# Patient Record
Sex: Female | Born: 1955 | Race: White | Hispanic: No | Marital: Married | Smoking: Never smoker
Health system: Southern US, Community
[De-identification: ages and names within clinical notes are randomized; demographics above are authoritative.]

## PROBLEM LIST (undated history)

## (undated) DIAGNOSIS — M539 Dorsopathy, unspecified: Secondary | ICD-10-CM

## (undated) DIAGNOSIS — L98499 Non-pressure chronic ulcer of skin of other sites with unspecified severity: Secondary | ICD-10-CM

## (undated) DIAGNOSIS — N3010 Interstitial cystitis (chronic) without hematuria: Secondary | ICD-10-CM

## (undated) DIAGNOSIS — I341 Nonrheumatic mitral (valve) prolapse: Secondary | ICD-10-CM

## (undated) DIAGNOSIS — M1990 Unspecified osteoarthritis, unspecified site: Secondary | ICD-10-CM

## (undated) DIAGNOSIS — B019 Varicella without complication: Secondary | ICD-10-CM

## (undated) DIAGNOSIS — Z9109 Other allergy status, other than to drugs and biological substances: Secondary | ICD-10-CM

## (undated) HISTORY — PX: OOPHORECTOMY: SHX86

## (undated) HISTORY — DX: Varicella without complication: B01.9

## (undated) HISTORY — DX: Other allergy status, other than to drugs and biological substances: Z91.09

## (undated) HISTORY — DX: Nonrheumatic mitral (valve) prolapse: I34.1

## (undated) HISTORY — PX: NASAL SEPTUM SURGERY: SHX37

## (undated) HISTORY — DX: Non-pressure chronic ulcer of skin of other sites with unspecified severity: L98.499

## (undated) HISTORY — PX: BLADDER SURGERY: SHX569

## (undated) HISTORY — PX: KNEE SURGERY: SHX244

## (undated) HISTORY — PX: MASTECTOMY: SHX3

## (undated) HISTORY — PX: TONSILLECTOMY: SUR1361

## (undated) HISTORY — PX: ABDOMINAL HYSTERECTOMY: SHX81

---

## 1998-04-07 ENCOUNTER — Ambulatory Visit (HOSPITAL_COMMUNITY): Admission: RE | Admit: 1998-04-07 | Discharge: 1998-04-07 | Payer: Self-pay | Admitting: Obstetrics & Gynecology

## 1998-04-07 ENCOUNTER — Encounter: Payer: Self-pay | Admitting: Obstetrics & Gynecology

## 1999-07-18 ENCOUNTER — Other Ambulatory Visit: Admission: RE | Admit: 1999-07-18 | Discharge: 1999-07-18 | Payer: Self-pay | Admitting: Obstetrics & Gynecology

## 1999-09-26 ENCOUNTER — Ambulatory Visit (HOSPITAL_COMMUNITY): Admission: RE | Admit: 1999-09-26 | Discharge: 1999-09-26 | Payer: Self-pay | Admitting: Obstetrics & Gynecology

## 1999-09-26 ENCOUNTER — Encounter: Payer: Self-pay | Admitting: Obstetrics & Gynecology

## 2000-03-21 ENCOUNTER — Inpatient Hospital Stay (HOSPITAL_COMMUNITY): Admission: RE | Admit: 2000-03-21 | Discharge: 2000-03-24 | Payer: Self-pay | Admitting: Obstetrics & Gynecology

## 2000-03-21 ENCOUNTER — Encounter (INDEPENDENT_AMBULATORY_CARE_PROVIDER_SITE_OTHER): Payer: Self-pay

## 2000-12-27 ENCOUNTER — Other Ambulatory Visit: Admission: RE | Admit: 2000-12-27 | Discharge: 2000-12-27 | Payer: Self-pay | Admitting: Obstetrics & Gynecology

## 2000-12-27 ENCOUNTER — Encounter: Payer: Self-pay | Admitting: Obstetrics & Gynecology

## 2000-12-27 ENCOUNTER — Ambulatory Visit (HOSPITAL_COMMUNITY): Admission: RE | Admit: 2000-12-27 | Discharge: 2000-12-27 | Payer: Self-pay | Admitting: Obstetrics & Gynecology

## 2010-12-23 NOTE — Discharge Summary (Signed)
[facility]  Patient:    Tanya Griffith, Tanya Griffith                   MRN: [id number] Adm. Date:  [id number] Disc. Date: [id number] Attending:  Minette Headland                           Discharge Summary  DISCHARGE DIAGNOSES: 1. Persistent complex adnexal mass. 2. Pelvic endometriosis 3. Extensive uterine adenomyosis. 4. Extensive pelvic adhesions.  OPERATIVE PROCEDURE: 1. Total abdominal hysterectomy with bilateral salpingo-oophorectomy. 2. Lysis of pelvic adhesions.  POSTOPERATIVE COMPLICATIONS:  Mild postoperative ileus, resolved spontaneously with conservative measures.  DISPOSITION AT DISCHARGE:  The patient was in good condition.  She was ambulating without difficulty.  She was having adequate bowel and bladder function at that point.  DISCHARGE MEDICATIONS:  She was discharged with Demerol tablets 15 mg to be taken 1-2 every 4-6 hours as needed for pain.  She was also to add ibuprofen. She is to resume any preoperative medications and is on hormone replacement therapy.  DISCHARGE INSTRUCTIONS: 1. She is to follow up in the office in two weeks. 2. She is to call for fever, severe pain or heavy bleeding. 3. She is to avoid ______ .  BRIEF HISTORY:  Details of the present illness are recorded in the admission or in the operative summary.  Briefly, the patient was found to have a persistent complex adnexal mass, thought most likely to be endometriosis.  She was admitted at this time for definitive surgery with a history also of metrorrhagia and dysmenorrhea.  LABORATORY DATA:  During this admission hemoglobin 12.6, hemoglobin 11.9 on the first postoperative day and 11.2 on the second postoperative day.  Her admission prothrombin time and INR and PTT were all normal.  Her urinalysis on admission was normal.  HOSPITAL COURSE:  The patient was admitted on the morning of surgery. She was taken to the operating room where the above described  operative procedure was accomplished without significant intraoperative difficulty.  Her postoperative course was without major complication.  She did have slow return of bowel function and by the morning of the second postoperative day had still not been able to pass gas rectally.  Her abdomen was soft but mildly distended. It was felt she had a mild ileus and it was elected to keep her until the morning of the third postoperative day.  By that time she was having adequate bowel function.  She was tolerating a regular diet.  She was discharged with disposition as noted above. DD:  04/18/00 TD:  04/18/00 Job: [id number]

## 2010-12-23 NOTE — Op Note (Signed)
[facility]  Patient:    Tanya Griffith, Tanya Griffith                   MRN: [id number] Proc. Date: 03/21/00 Adm. Date:  [id number] Attending:  Minette Headland                           Operative Report  PREOPERATIVE DIAGNOSES:       Complex right adnexal mass, dysfunctional uterine bleeding, known history of significant pelvic endometriosis and pelvic adhesions.  POSTOPERATIVE DIAGNOSES:      Complex right adnexal mass, dysfunctional uterine bleeding, known history of significant pelvic endometriosis and pelvic adhesions.  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  OPERATION PROCEDURE:          Total abdominal hysterectomy, right salpingo-oophorectomy, lysis of pelvic adhesions, inspection of appendix with normal findings.  Palpation of the gallbladder revealed at least one small stone or polyp measuring 3-4 mm.  There were no other palpable abnormalities of the upper abdomen.  ANESTHESIA:                   General endotracheal.  INTRAOPERATIVE COMPLICATIONS: None.  ESTIMATED INTRAOPERATIVE BLOOD LOSS:                   200 cc.  DETAILS OF PRESENT ILLNESS:   Recorded in the admission.  Patient was admitted on the morning of surgery.  She was given antibiotic prophylaxis for ______ using ampicillin and gentamicin.  She is known to have mitral regurgitation, which is asymptomatic.  DESCRIPTION OF PROCEDURE:     She was placed in PAS hoses.  She was brought to the operating room and placed under adequate general endotracheal anesthesia and placed in the dorsal recumbent position.  Abdomen, perineum and vagina were prepped in the usual fashion and a Foley catheter was placed using sterile technique.  Sterile drapes were applied.  Lower abdominal transverse incision was made through an old scar and carried sharply down to fascia. Fascia was entered sharply then extended to the extent of  skin incision. Rectus sheath was developed superiorly and inferiorly with blunt and sharp dissection. Rectus muscles were divided in the midline and the peritoneum was entered sharply and extended bluntly and sharply to the extent of the skin incision.  The adhesions were immediately noticed the bowel to uterus and to left adnexa.  There was distortion and ______  of the right fallopian tube, which was in a mass with the ovary at the superior pole of the uterine cornu. Self-retaining OConnor-OSullivan retractor was placed and large packs were used to pack intestinal contents out of the pelvis.  Careful sharp dissection was utilized to free a loop of ileum from the right adnexa.  Later during the procedure before completion, the residual ovarian tissue left on the bowel was carefully sharply dissected free.  Bowel muscularis was not entered. Figure-of-eights of 3-0 Monocryl using GI needle were used for complete hemostasis at this site.  Adhesions of sigmoid epiploica to the left adnexa were free sharply and bluntly.  Proximal broad ligaments were clamped with Kellys.  A pedicle was developed on the left, which was the remnant of the infundibular pelvic using a curved Heaney.  This was divided sharply and ligated with 0 Monocryl.  The right side was taken with a figure-of-eight of 0 Monocryl for the round ligament, which  was then divided.  The infundibular pelvic ligament was developed, cross-clamped, divided sharply and doubly ligated with free tie of 0 Monocryl, followed by suture ligature of 0 Monocryl.  Vessel pedicles were skeletonized.  Adhesions of the anterior peritoneum were freed from the cervix and lower uterine segment and the bladder bluntly and sharply dissected off the lower segment and cervix. Curved Heaneys were placed on the vessel pedicles on each side, divided sharply and ligated with 0 Monocryl.  Straight Heaneys were used to develop the cardinal ligament pedicles.   These too were divided sharply and ligated with 0 Monocryl.  Curved Heaneys were placed on uterosacrals and angles of vagina.  These pedicles were divided sharply and ligated with 0 Monocryl in a Heaney fashion.  Cervix was completely excised and inspected and found to be completely removed.  Cuff was closed with figure-of-eights of 0 Monocryl. Small bleeding source along the right uterosacral was fulgurated with the Bovie and complete hemostasis was achieved.  Irrigation was carried out. Systematic examination of the pelvic structures dissected during the procedure were examined and hemostasis was noted to be complete.  All packs, needles and instruments removed, counts were correct.  Abdominal incision was then closed in layers.  Figure-of-eights of 0 Monocryl were used to reapproximate the rectus muscles.  Fascia was closed with running 0 PDS.  Subcu was closed with interrupted 0 Monocryl or 2-0 plains.  Subcutaneous vessels were controlled with the Bovie and figure-of-eights of 0 Monocryl in one location near the midline.  Skin was closed with wide skin staples and 1/4 inch Steri-Strips. The patient tolerated the procedure well and was taken to recovery in good condition. DD:  03/21/00 TD:  03/21/00 Job: [id number]

## 2011-04-16 ENCOUNTER — Encounter: Payer: Self-pay | Admitting: Emergency Medicine

## 2011-04-16 ENCOUNTER — Inpatient Hospital Stay (INDEPENDENT_AMBULATORY_CARE_PROVIDER_SITE_OTHER)
Admission: RE | Admit: 2011-04-16 | Discharge: 2011-04-16 | Disposition: A | Discharge status: Home / Self Care | Payer: 59 | Source: Ambulatory Visit | Attending: Emergency Medicine | Admitting: Emergency Medicine

## 2011-04-16 DIAGNOSIS — J069 Acute upper respiratory infection, unspecified: Secondary | ICD-10-CM

## 2011-04-16 DIAGNOSIS — R059 Cough, unspecified: Secondary | ICD-10-CM

## 2011-04-16 DIAGNOSIS — R05 Cough: Secondary | ICD-10-CM

## 2011-07-10 NOTE — Progress Notes (Signed)
Summary: cough/TM   Vital Signs:  Patient Profile:   55 Years Old Female CC:      Cough Height:     59 inches Weight:      174 pounds O2 treatment:    Room Air Temp:     98.1 degrees F oral Pulse rate:   92 / minute Resp:     20 per minute BP sitting:   125 / 83  (left arm) Cuff size:   regular  Vitals Entered By: Linton Flemings RN (April 16, 2011 4:38 PM)                  Updated Prior Medication List: AMBIEN 10 MG TABS (ZOLPIDEM TARTRATE) HS  Current Allergies: ! * ENVIROMENTALHistory of Present Illness History from: patient Chief Complaint: Cough History of Present Illness: 55 Years Old Female complains of onset of cold symptoms for 2.5 weeks.  Tanya Griffith has been using Zyrtec-D which is helping a little bit. No sore throat + cough No pleuritic pain No wheezing No nasal congestion + post-nasal drainage No sinus pain/pressure No chest congestion No itchy/red eyes No earache No hemoptysis No SOB No chills/sweats No fever No nausea No vomiting No abdominal pain No diarrhea No skin rashes + fatigue No myalgias No headache   REVIEW OF SYSTEMS Constitutional Symptoms       Complains of fatigue.     Denies fever, chills, night sweats, weight loss, and weight gain.  Eyes       Complains of eye drainage.      Denies change in vision, eye pain, glasses, contact lenses, and eye surgery. Ear/Nose/Throat/Mouth       Complains of frequent runny nose and hoarseness.      Denies hearing loss/aids, change in hearing, ear pain, ear discharge, dizziness, frequent nose bleeds, sinus problems, sore throat, and tooth pain or bleeding.  Respiratory       Complains of dry cough.      Denies productive cough, wheezing, shortness of breath, asthma, bronchitis, and emphysema/COPD.  Cardiovascular       Complains of murmurs and chest pain.      Denies tires easily with exhertion.    Gastrointestinal       Complains of nausea/vomiting.      Denies stomach pain, diarrhea,  constipation, blood in bowel movements, and indigestion. Genitourniary       Denies painful urination, kidney stones, and loss of urinary control. Neurological       Complains of headaches.      Denies paralysis, seizures, and fainting/blackouts. Musculoskeletal       Denies muscle pain, joint pain, joint stiffness, decreased range of motion, redness, swelling, muscle weakness, and gout.  Skin       Denies bruising, unusual mles/lumps or sores, and hair/skin or nail changes.  Psych       Denies mood changes, temper/anger issues, anxiety/stress, speech problems, depression, and sleep problems. Other Comments: COUGH X 2 WEEKS   Past History:  Past Medical History: INTERSTISTIAL CYSTIS  Past Surgical History: Denies surgical history  Social History: SMOKE-NO ALCOHOL-NO REC. DRUGS-NO Physical Exam General appearance: well developed, well nourished, no acute distress Ears: normal, no lesions or deformities Nasal: mucosa pink, nonedematous, no septal deviation, turbinates normal Oral/Pharynx: clear PND, no erythema Chest/Lungs: no rales, wheezes, or rhonchi bilateral, breath sounds equal without effort Heart: regular rate and  rhythm, no murmur MSE: oriented to time, place, and person Assessment New Problems: ALLERGIC RHINITIS (ICD-477.9) COUGH (ICD-786.2)  UPPER RESPIRATORY INFECTION, ACUTE (ICD-465.9)   Plan New Medications/Changes: AMOXICILLIN 875 MG TABS (AMOXICILLIN) 1 by mouth two times a day for 7 days  #14 x 0, 04/16/2011, Hoyt Koch MD TESSALON 200 MG CAPS (BENZONATATE) 1 by mouth three times a day as needed for cough  #21 x 0, 04/16/2011, Hoyt Koch MD  New Orders: New Patient Level III [phone] Pulse Oximetry (single measurment) [94760] Planning Comments:   1)  Take the prescribed antibiotic as instructed.  Tessalon for cough. 2)  Use nasal saline solution (over the counter) at least 3 times a day. 3)  Use over the counter decongestants like  Zyrtec-D every 12 hours as needed to help with congestion. 4)  Can take tylenol every 6 hours or motrin every 8 hours for pain or fever. 5)  Follow up with your primary doctor  if no improvement in 5-7 days, sooner if increasing pain, fever, or new symptoms.  If not improving, need to consider Flonase vs prednisone.   The patient and/or caregiver has been counseled thoroughly with regard to medications prescribed including dosage, schedule, interactions, rationale for use, and possible side effects and they verbalize understanding.  Diagnoses and expected course of recovery discussed and will return if not improved as expected or if the condition worsens. Patient and/or caregiver verbalized understanding.  Prescriptions: AMOXICILLIN 875 MG TABS (AMOXICILLIN) 1 by mouth two times a day for 7 days  #14 x 0   Entered and Authorized by:   Hoyt Koch MD   Signed by:   Hoyt Koch MD on 04/16/2011   Method used:   Print then Give to Patient   RxID:   [id number] TESSALON 200 MG CAPS (BENZONATATE) 1 by mouth three times a day as needed for cough  #21 x 0   Entered and Authorized by:   Hoyt Koch MD   Signed by:   Hoyt Koch MD on 04/16/2011   Method used:   Print then Give to Patient   RxID:   [id number]   Orders Added: 1)  New Patient Level III [64403] 2)  Pulse Oximetry (single measurment) [47425]

## 2012-01-05 ENCOUNTER — Telehealth: Payer: Self-pay | Admitting: *Deleted

## 2012-01-05 ENCOUNTER — Encounter: Payer: Self-pay | Admitting: Emergency Medicine

## 2012-01-05 ENCOUNTER — Emergency Department (INDEPENDENT_AMBULATORY_CARE_PROVIDER_SITE_OTHER)
Admission: EM | Admit: 2012-01-05 | Discharge: 2012-01-05 | Disposition: A | Discharge status: Home / Self Care | Payer: 59 | Source: Home / Self Care | Attending: Emergency Medicine | Admitting: Emergency Medicine

## 2012-01-05 DIAGNOSIS — H01009 Unspecified blepharitis unspecified eye, unspecified eyelid: Secondary | ICD-10-CM

## 2012-01-05 MED ORDER — PREDNISONE (PAK) 10 MG PO TABS: 10.0000 mg | ORAL_TABLET | Freq: Every day | ORAL | Status: AC

## 2012-01-05 MED ORDER — OLOPATADINE HCL 0.1 % OP SOLN: 1.0000 [drp] | Freq: Two times a day (BID) | OPHTHALMIC | Status: AC

## 2012-01-05 NOTE — ED Notes (Signed)
Bilateral eye lid swelling redness, itching, burning, eyes tearing x 3 days

## 2012-01-05 NOTE — ED Provider Notes (Signed)
History     CSN: [id number]  Arrival date & time 01/05/12  1114   First MD Initiated Contact with Patient 01/05/12 1116      Chief Complaint  Patient presents with  . Eye Problem    (Consider location/radiation/quality/duration/timing/severity/associated sxs/prior treatment) HPI Tanya Griffith presents today with an EYE COMPLAINT.  Eyelid swelling.  This has happened before.  She does have a history of seasonal allergies.  No trauma or flying debris.  She does wear makeup but nothing is changed recently.  She has tried some topical hydrocortisone cream around the eyelids which has helped.  Location: bilateral  Onset: 3  Days   Symptoms: Redness: no Discharge: no Pain: no Photophobia: no Decreased Vision: no URI symptoms: no Itching/Allergy sxs: no Glaucoma: no Recent eye surgery: no Contact lens use: no  Red Flags Trauma: no Foreign Body: no Vomiting/HA: no Halos around lights: no Chickenpox or zoster: no     No past medical history on file.  Past Surgical History  Procedure Date  . Abdominal hysterectomy   . Tonsillectomy   . Bladder surgery     No family history on file.  History  Substance Use Topics  . Smoking status: Not on file  . Smokeless tobacco: Not on file  . Alcohol Use:     OB History    Grav Para Term Preterm Abortions TAB SAB Ect Mult Living                  Review of Systems  All other systems reviewed and are negative.    Allergies  Review of patient's allergies indicates no known allergies.  Home Medications   Current Outpatient Rx  Name Route Sig Dispense Refill  . OLOPATADINE HCL 0.1 % OP SOLN Both Eyes Place 1 drop into both eyes 2 (two) times daily. 5 mL 2  . PREDNISONE (PAK) 10 MG PO TABS Oral Take 1 tablet (10 mg total) by mouth daily. 6 day pack, use as directed, Disp 1 pack 21 tablet 0    BP 121/77  Pulse 78  Temp(Src) 98.3 F (36.8 C) (Oral)  Resp 12  Ht 5' (1.524 m)  Wt 176 lb (79.833 kg)  BMI 34.37 kg/m2   SpO2 97%  Physical Exam  Nursing note and vitals reviewed. Constitutional: She is oriented to person, place, and time. She appears well-developed and well-nourished.  HENT:  Head: Normocephalic and atraumatic.  Eyes: Conjunctivae and EOM are normal. Pupils are equal, round, and reactive to light. Right eye exhibits no discharge and no exudate. Left eye exhibits no discharge and no exudate. No scleral icterus.       Mild eyelid swelling bilaterally, no erythema or signs of bacterial infection.  No foreign bodies.  No discharge.  Neck: Neck supple.  Cardiovascular: Regular rhythm and normal heart sounds.   Pulmonary/Chest: Effort normal and breath sounds normal. No respiratory distress.  Neurological: She is alert and oriented to person, place, and time.  Skin: Skin is warm and dry.  Psychiatric: She has a normal mood and affect. Her speech is normal.    ED Course  Procedures (including critical care time)  Labs Reviewed - No data to display No results found.   1. 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  I gave her prescription for prednisone that she will likely only need about half of.  I also gave her prescription for allergy eyedrops.  She can also  use an allergy oral pill such as Benadryl.  Encourage cool compresses on eyes and sleeping with head elevated.    Marlaine Hind, MD 01/05/12 [phone]

## 2013-03-05 ENCOUNTER — Encounter: Payer: Self-pay | Admitting: *Deleted

## 2013-03-05 ENCOUNTER — Emergency Department
Admission: EM | Admit: 2013-03-05 | Discharge: 2013-03-05 | Disposition: A | Discharge status: Home / Self Care | Payer: No Typology Code available for payment source | Source: Home / Self Care

## 2013-03-05 DIAGNOSIS — J329 Chronic sinusitis, unspecified: Secondary | ICD-10-CM

## 2013-03-05 DIAGNOSIS — H6091 Unspecified otitis externa, right ear: Secondary | ICD-10-CM

## 2013-03-05 DIAGNOSIS — H60399 Other infective otitis externa, unspecified ear: Secondary | ICD-10-CM

## 2013-03-05 HISTORY — DX: Interstitial cystitis (chronic) without hematuria: N30.10

## 2013-03-05 MED ORDER — AMOXICILLIN 875 MG PO TABS: 875.0000 mg | ORAL_TABLET | Freq: Two times a day (BID) | ORAL | Status: DC

## 2013-03-05 MED ORDER — NEOMYCIN-POLYMYXIN-HC 3.5-10000-1 OT SOLN: 3.0000 [drp] | Freq: Four times a day (QID) | OTIC | Status: AC

## 2013-03-05 NOTE — ED Notes (Signed)
Pt c/o Rt ear pain x several weeks, with HA x 1wk and RT eye redness, and RT sided tooth ache x today.

## 2013-03-05 NOTE — ED Provider Notes (Signed)
CSN: [id number]     Arrival date & time 03/05/13  1533 History     None    Chief Complaint  Patient presents with  . Nasal Congestion    HPI  URI Symptoms Onset: 1-2 weeks  Description: ear pain, sinus pressure, nasal congestion, tooth pain  Modifying factors:  None   Symptoms Nasal discharge: mild Fever: no Sore throat: no Cough: no Wheezing: no Ear pain: no GI symptoms: no Sick contacts: no  Red Flags  Stiff neck: no Dyspnea: no Rash: no Swallowing difficulty: no  Sinusitis Risk Factors Headache/face pain: yes Double sickening: yes tooth pain: yes  Allergy Risk Factors Sneezing: no Itchy scratchy throat: no Seasonal symptoms: no  Flu Risk Factors Headache: no muscle aches: no severe fatigue: no   Past Medical History  Diagnosis Date  . IC (interstitial cystitis)    Past Surgical History  Procedure Laterality Date  . Abdominal hysterectomy    . Tonsillectomy    . Bladder surgery     Family History  Problem Relation Age of Onset  . Dementia Mother   . Cancer Sister     skin   History  Substance Use Topics  . Smoking status: Never Smoker   . Smokeless tobacco: Not on file  . Alcohol Use: No   OB History   Grav Para Term Preterm Abortions TAB SAB Ect Mult Living                 Review of Systems  All other systems reviewed and are negative.    Allergies  Other  Home Medications   Current Outpatient Rx  Name  Route  Sig  Dispense  Refill  . zolpidem (AMBIEN) 10 MG tablet   Oral   Take 10 mg by mouth at bedtime as needed for sleep.         Marland Kitchen amoxicillin (AMOXIL) 875 MG tablet   Oral   Take 1 tablet (875 mg total) by mouth 2 (two) times daily.   20 tablet   0   . neomycin-polymyxin-hydrocortisone (CORTISPORIN) otic solution   Right Ear   Place 3 drops into the right ear 4 (four) times daily.   10 mL   0    BP 145/84  Pulse 91  Temp(Src) 98.3 F (36.8 C) (Oral)  Resp 16  Wt 170 lb (77.111 kg)  BMI 33.2 kg/m2   SpO2 97% Physical Exam  Constitutional: She appears well-developed and well-nourished.  HENT:  Head: Atraumatic.  Left Ear: External ear normal.  r ear canal erythema and tenderness to otoscopic evaluation  +nasal erythema, rhinorrhea bilaterally, + post oropharyngeal erythema  + R maxillary TTP    Eyes: Conjunctivae are normal. Pupils are equal, round, and reactive to light.  Neck: Normal range of motion.  Cardiovascular: Normal rate and regular rhythm.   Pulmonary/Chest: Effort normal.  Abdominal: Soft.  Musculoskeletal: Normal range of motion.  Lymphadenopathy:    She has no cervical adenopathy.  Neurological: She is alert.  Skin: Skin is warm.    ED Course   Procedures (including critical care time)  Labs Reviewed - No data to display No results found. 1. OE (otitis externa), right   2. Sinusitis     MDM  Will treat with amox and cortisporin Discussed general and ENT red flags.  Follow up as needed.     The patient and/or caregiver has been counseled thoroughly with regard to treatment plan and/or medications prescribed including dosage, schedule, interactions, rationale  for use, and possible side effects and they verbalize understanding. Diagnoses and expected course of recovery discussed and will return if not improved as expected or if the condition worsens. Patient and/or caregiver verbalized understanding.       Doree Albee, MD 03/05/13 1640

## 2014-04-21 ENCOUNTER — Telehealth: Payer: Self-pay | Admitting: Family Medicine

## 2014-04-21 ENCOUNTER — Encounter: Payer: Self-pay | Admitting: Emergency Medicine

## 2014-04-21 ENCOUNTER — Emergency Department
Admission: EM | Admit: 2014-04-21 | Discharge: 2014-04-21 | Disposition: A | Discharge status: Home / Self Care | Payer: 59 | Source: Home / Self Care | Attending: Physician Assistant | Admitting: Physician Assistant

## 2014-04-21 DIAGNOSIS — W19XXXA Unspecified fall, initial encounter: Secondary | ICD-10-CM

## 2014-04-21 DIAGNOSIS — S199XXA Unspecified injury of neck, initial encounter: Secondary | ICD-10-CM

## 2014-04-21 DIAGNOSIS — S0993XA Unspecified injury of face, initial encounter: Secondary | ICD-10-CM

## 2014-04-21 HISTORY — DX: Dorsopathy, unspecified: M53.9

## 2014-04-21 HISTORY — DX: Unspecified osteoarthritis, unspecified site: M19.90

## 2014-04-21 HISTORY — DX: Interstitial cystitis (chronic) without hematuria: N30.10

## 2014-04-21 MED ORDER — PREDNISONE 50 MG PO TABS
50.0000 mg | ORAL_TABLET | Freq: Every day | ORAL | Status: DC
Start: 2014-04-21 — End: 2014-06-05

## 2014-04-21 NOTE — Telephone Encounter (Signed)
agree

## 2014-04-21 NOTE — Telephone Encounter (Signed)
Caller name: Jarelyn  Relation to pt: self  Call back number: [phone]   Reason for call:   New Patient has an upcoming appt 10/30.pt fell 9/13 face is swollen pt would like to schedule an acute visit rather then go to urgent care. Please advise

## 2014-04-21 NOTE — Discharge Instructions (Signed)
Ice and ibuprofen.  Prednisone for 5 days.

## 2014-04-21 NOTE — ED Provider Notes (Signed)
CSN: [id number]     Arrival date & time 04/21/14  1443 History   First MD Initiated Contact with Patient 04/21/14 1455     Chief Complaint  Patient presents with  . Facial Pain   (Consider location/radiation/quality/duration/timing/severity/associated sxs/prior Treatment) HPI Pt presents to the clinic after a fall on Sunday, 2 days ago. She was walking and talking at [facility] when she tripped over uneven sidewalk and landed on right hand and right side of face. She had a little bit of a nose bleed and was sore but no major pain. No pain with chewing or moving jaw. She has taken aleve and seems to help pain. She denies any teeth being loose. No pain with eating and face just sore. She is concerned because the right side of her face does feel numb. Seems to be getting some better but wanted to make sure nothing wrong.    Past Medical History  Diagnosis Date  . IC (interstitial cystitis)   . Interstitial cystitis   . 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 Back problem    Past Surgical History  Procedure Laterality Date  . Abdominal hysterectomy    . Tonsillectomy    . Bladder surgery    . Oophorectomy    . Knee surgery Right   . Bladder surgery     Family History  Problem Relation Age of Onset  . Dementia Mother   . Alzheimer's disease Mother   . Cancer Sister     skin   History  Substance Use Topics  . Smoking status: Never Smoker   . Smokeless tobacco: Not on file  . Alcohol Use: No   OB History   Grav Para Term Preterm Abortions TAB SAB Ect Mult Living                 Review of Systems  All other systems reviewed and are negative.   Allergies  Other and Sulfa antibiotics  Home Medications   Prior to Admission medications   Medication Sig Start Date End Date Taking? Authorizing Provider  amoxicillin (AMOXIL) 875 MG tablet Take 1 tablet (875 mg total) by mouth 2 (two) times daily. 03/05/13   Shanda Howells, MD  predniSONE (DELTASONE)  50 MG tablet Take 1 tablet (50 mg total) by mouth daily. 04/21/14   Lanorris Kalisz L Tiffini Blacksher, PA-C  zolpidem (AMBIEN) 10 MG tablet Take 10 mg by mouth at bedtime as needed for sleep.    Historical Provider, MD   BP 128/81  Pulse 73  Temp(Src) 98.1 F (36.7 C) (Oral)  Resp 16  Ht 5' (1.524 m)  Wt 168 lb (76.204 kg)  BMI 32.81 kg/m2  SpO2 97% Physical Exam  Constitutional: She appears well-developed and well-nourished.  HENT:  Head:    Right Ear: External ear normal.  Left Ear: External ear normal.  Mouth/Throat: Oropharynx is clear and moist.  No significant pain with palpation over bones of face.  Teeth are strong and not firm in place.  Most pain over areas where her face is bruised.  No pain over septum of nose. Pain with movement of cartilage. Nasal turbinate of right nares very swollen.    Eyes: Conjunctivae are normal. Right eye exhibits no discharge. Left eye exhibits no discharge.  Neck: Normal range of motion. Neck supple.  Lymphadenopathy:    She has no cervical adenopathy.  Psychiatric: She has a normal mood and affect. Her behavior is normal.    ED  Course  Procedures (including critical care time) Labs Review Labs Reviewed - No data to display  Imaging Review No results found.   MDM   1. Fall, initial encounter   2. Facial trauma, initial encounter    After physical exam there was no area of concern for acute fracture. All pain seems to be more soft tissue related.  Reassured pt that numbness coming from soft tissue swelling and trauma. It can take a couple of weeks for nerves to heal and all swelling to go down.  Given prednisone to help with inflammation.  Continue ice packs as needed and Aleve for pain.  Follow up with any worsening signs of symptoms.       Donella Stade, PA-C 04/21/14 1603

## 2014-04-21 NOTE — ED Notes (Signed)
Pt reports that she fell on a sidewalk x 2 days ago and hit her face on the sidewalk. No OTC meds.

## 2014-04-21 NOTE — Telephone Encounter (Addendum)
C/o: On 9/13, pt fell while at the [facility]  She tripped and fell on sidewalk hitting her face.  She sustained a headache and nosebleed that day.  + right sided facial swelling and bruising, and bilateral knee bruising down her legs.  She also states that she feels pressure (similar to a sinus infection) and tingling under her right eye, down her nose, to the top of her lips.  She denies a headache today.  She alert and oriented x 3.  Denies dizziness, N/V, and memory issues.    New patient appointment scheduled with Dr. Etter Sjogren on 06/05/14 @ 3 pm.    Advice:  Go to urgent care today for assessment.  Pt stated understanding and was in agreement with plan.

## 2014-04-27 NOTE — ED Provider Notes (Signed)
Agree with exam, assessment, and plan.   Kandra Nicolas, MD 04/27/14 1159

## 2014-06-05 ENCOUNTER — Encounter: Payer: Self-pay | Admitting: Family Medicine

## 2014-06-05 ENCOUNTER — Ambulatory Visit (INDEPENDENT_AMBULATORY_CARE_PROVIDER_SITE_OTHER): Payer: 59 | Admitting: Family Medicine

## 2014-06-05 VITALS — BP 140/80 | HR 72 | Temp 98.1°F | Wt 172.2 lb

## 2014-06-05 DIAGNOSIS — Z833 Family history of diabetes mellitus: Secondary | ICD-10-CM

## 2014-06-05 DIAGNOSIS — R011 Cardiac murmur, unspecified: Secondary | ICD-10-CM

## 2014-06-05 DIAGNOSIS — Z0000 Encounter for general adult medical examination without abnormal findings: Secondary | ICD-10-CM

## 2014-06-05 DIAGNOSIS — Z1239 Encounter for other screening for malignant neoplasm of breast: Secondary | ICD-10-CM

## 2014-06-05 DIAGNOSIS — E2839 Other primary ovarian failure: Secondary | ICD-10-CM

## 2014-06-05 LAB — POCT URINALYSIS DIPSTICK
Bilirubin, UA: NEGATIVE
Blood, UA: NEGATIVE
GLUCOSE UA: NEGATIVE
Ketones, UA: NEGATIVE
LEUKOCYTES UA: NEGATIVE
NITRITE UA: NEGATIVE
PROTEIN UA: NEGATIVE
Spec Grav, UA: 1.015
Urobilinogen, UA: 0.2
pH, UA: 6

## 2014-06-05 NOTE — Progress Notes (Signed)
Subjective:     Tanya Griffith is a 58 y.o. female and is here for a comprehensive physical exam. The patient reports no problems.  History   Social History  . Marital Status: Married    Spouse Name: N/A    Number of Children: N/A  . Years of Education: N/A   Occupational History  . Not on file.   Social History Main Topics  . Smoking status: Never Smoker   . Smokeless tobacco: Not on file  . Alcohol Use: No  . Drug Use: No  . Sexual Activity: Not on file   Other Topics Concern  . Not on file   Social History Narrative  . No narrative on file   Health Maintenance  Topic Date Due  . Tetanus/tdap  02/02/1974  . Mammogram  02/02/2005  . Colonoscopy  02/02/2005  . 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 She  does not have a problem list on file. She  has past surgical history that includes Abdominal hysterectomy; Tonsillectomy; Bladder surgery; Oophorectomy; Knee surgery (Right); Bladder surgery; Nasal septum surgery; and Cesarean section. Her family history includes Alzheimer's disease in her mother; Arthritis in her brother, maternal aunt, maternal grandfather, and mother; Cancer in her sister; Dementia in her mother; Diabetes in her brother and maternal aunt; Heart attack in her paternal grandfather; Hyperlipidemia in her mother; Hypertension in her brother; Kidney disease (age of onset: 78) in her mother; Transient ischemic attack in her maternal aunt and mother. She  reports that she has never smoked. She does not have any smokeless tobacco history on file. She reports that she does not drink alcohol or use illicit  drugs. She has a current medication list which includes the following prescription(s): aspirin, calcium-magnesium, flax seed oil, msm, mirabegron er, multiple vitamins-minerals, and fish oil. No current outpatient prescriptions on file prior to visit.   No current facility-administered medications on file prior to visit.   She is allergic to other and sulfa antibiotics..  Review of Systems Review of Systems  Constitutional: Negative for activity change, appetite change and fatigue.  HENT: Negative for hearing loss, congestion, tinnitus and ear discharge.  dentist q71m Eyes: Negative for visual disturbance (see optho-- due) Respiratory: Negative for cough, chest tightness and shortness of breath.   Cardiovascular: Negative for chest pain, palpitations and leg swelling.  Gastrointestinal: Negative for abdominal pain, diarrhea, constipation and abdominal distention.  Genitourinary: Negative for urgency, frequency, decreased urine volume and difficulty urinating.  Musculoskeletal: Negative for back pain, arthralgias and gait problem.  Skin: Negative for color change, pallor and rash.  Neurological: Negative for dizziness, light-headedness, numbness and headaches.  Hematological: Negative for adenopathy. Does not bruise/bleed easily.  Psychiatric/Behavioral: Negative for suicidal ideas, confusion, sleep disturbance, self-injury, dysphoric mood, decreased concentration and agitation.    Past Medical History  Diagnosis Date  . IC (interstitial cystitis)   . Interstitial cystitis   . 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 Back problem   . Chicken pox   . Abdominal wall ulcer   . Environmental allergies   . Mitral valve prolapse    History   Social History  . Marital Status: Married  Spouse Name: N/A    Number of Children: N/A  . Years of Education: N/A   Occupational History  . Not on file.   Social History Main Topics  . Smoking status: Never Smoker    . Smokeless tobacco: Not on file  . Alcohol Use: No  . Drug Use: No  . Sexual Activity: Not on file   Other Topics Concern  . Not on file   Social History Narrative  . No narrative on file   Family History  Problem Relation Age of Onset  . Dementia Mother     Vascular Dementia  . Alzheimer's disease Mother   . Cancer Sister     skin  . Arthritis Mother   . Arthritis Brother   . Arthritis Maternal Grandfather   . Arthritis Maternal Aunt   . Hyperlipidemia Mother   . Transient ischemic attack Mother   . Heart attack Paternal Grandfather   . Transient ischemic attack Maternal Aunt   . Hypertension Brother   . Kidney disease Mother 48    Kidney Failure  . Diabetes Maternal Aunt   . Diabetes Brother     Medication induced   Current Outpatient Prescriptions  Medication Sig Dispense Refill  . aspirin 325 MG tablet Take 325 mg by mouth daily.      . Calcium-Magnesium (CAL-MAG PO) Take 1 tablet by mouth.      . Flaxseed, Linseed, (FLAX SEED OIL) 1000 MG CAPS Take by mouth.      . Methylsulfonylmethane (MSM) 1000 MG TABS Take by mouth.      . mirabegron ER (MYRBETRIQ) 50 MG TB24 tablet Take 50 mg by mouth daily.      . Multiple Vitamins-Minerals (CENTRUM SILVER PO) Take by mouth.      . Omega-3 Fatty Acids (FISH OIL) 1000 MG CAPS Take by mouth.       No current facility-administered medications for this visit.   Family History  Problem Relation Age of Onset  . Dementia Mother     Vascular Dementia  . Alzheimer's disease Mother   . Cancer Sister     skin  . Arthritis Mother   . Arthritis Brother   . Arthritis Maternal Grandfather   . Arthritis Maternal Aunt   . Hyperlipidemia Mother   . Transient ischemic attack Mother   . Heart attack Paternal Grandfather   . Transient ischemic attack Maternal Aunt   . Hypertension Brother   . Kidney disease Mother 43    Kidney Failure  . Diabetes Maternal Aunt   . Diabetes Brother     Medication induced   Allergies   Allergen Reactions  . Other Other (See Comments)    Pain meds--vomiting  . Sulfa Antibiotics       Objective:    BP 140/80  Pulse 72  Temp(Src) 98.1 F (36.7 C) (Oral)  Wt 172 lb 3.2 oz (78.109 kg)  SpO2 95% General appearance: alert, cooperative, appears stated age and no distress Head: Normocephalic, without obvious abnormality, atraumatic Eyes: conjunctivae/corneas clear. PERRL, EOM's intact. Fundi benign. Ears: normal TM's and external ear canals both ears Nose: Nares normal. Septum midline. Mucosa normal. No drainage or sinus tenderness. Throat: lips, mucosa, and tongue normal; teeth and gums normal Neck: no adenopathy, no carotid bruit, no JVD, supple, symmetrical, trachea midline and thyroid not enlarged, symmetric, no tenderness/mass/nodules Back: symmetric, no curvature. ROM normal. No CVA tenderness. Lungs: clear to auscultation bilaterally Breasts: normal appearance, no masses or tenderness Heart: S1, S2 normal-- +  murmur Abdomen: soft, non-tender; bowel sounds normal; no masses,  no organomegaly Pelvic: not indicated; status post hysterectomy, negative ROS Extremities: extremities normal, atraumatic, no cyanosis or edema Pulses: 2+ and symmetric Skin: Skin color, texture, turgor normal. No rashes or lesions Lymph nodes: Cervical, supraclavicular, and axillary nodes normal. Neurologic: Alert and oriented X 3, normal strength and tone. Normal symmetric reflexes. Normal coordination and gait Psych- no depression, no anxiety      Ekg-- sinus brady Assessment:    Healthy female exam.       Plan:    ghm utd Check labs Pt does not want any vaccines mammo and bmd ordered See After Visit Summary for Counseling Recommendations

## 2014-06-05 NOTE — Patient Instructions (Signed)
Preventive Care for Adults A healthy lifestyle and preventive care can promote health and wellness. Preventive health guidelines for women include the following key practices.  A routine yearly physical is a good way to check with your health care provider about your health and preventive screening. It is a chance to share any concerns and updates on your health and to receive a thorough exam.  Visit your dentist for a routine exam and preventive care every 6 months. Brush your teeth twice a day and floss once a day. Good oral hygiene prevents tooth decay and gum disease.  The frequency of eye exams is based on your age, health, family medical history, use of contact lenses, and other factors. Follow your health care provider's recommendations for frequency of eye exams.  Eat a healthy diet. Foods like vegetables, fruits, whole grains, low-fat dairy products, and lean protein foods contain the nutrients you need without too many calories. Decrease your intake of foods high in solid fats, added sugars, and salt. Eat the right amount of calories for you.Get information about a proper diet from your health care provider, if necessary.  Regular physical exercise is one of the most important things you can do for your health. Most adults should get at least 150 minutes of moderate-intensity exercise (any activity that increases your heart rate and causes you to sweat) each week. In addition, most adults need muscle-strengthening exercises on 2 or more days a week.  Maintain a healthy weight. The body mass index (BMI) is a screening tool to identify possible weight problems. It provides an estimate of body fat based on height and weight. Your health care provider can find your BMI and can help you achieve or maintain a healthy weight.For adults 20 years and older:  A BMI below 18.5 is considered underweight.  A BMI of 18.5 to 24.9 is normal.  A BMI of 25 to 29.9 is considered overweight.  A BMI of  30 and above is considered obese.  Maintain normal blood lipids and cholesterol levels by exercising and minimizing your intake of saturated fat. Eat a balanced diet with plenty of fruit and vegetables. Blood tests for lipids and cholesterol should begin at age 76 and be repeated every 5 years. If your lipid or cholesterol levels are high, you are over 50, or you are at high risk for heart disease, you may need your cholesterol levels checked more frequently.Ongoing high lipid and cholesterol levels should be treated with medicines if diet and exercise are not working.  If you smoke, find out from your health care provider how to quit. If you do not use tobacco, do not start.  Lung cancer screening is recommended for adults aged 22-80 years who are at high risk for developing lung cancer because of a history of smoking. A yearly low-dose CT scan of the lungs is recommended for people who have at least a 30-pack-year history of smoking and are a current smoker or have quit within the past 15 years. A pack year of smoking is smoking an average of 1 pack of cigarettes a day for 1 year (for example: 1 pack a day for 30 years or 2 packs a day for 15 years). Yearly screening should continue until the smoker has stopped smoking for at least 15 years. Yearly screening should be stopped for people who develop a health problem that would prevent them from having lung cancer treatment.  If you are pregnant, do not drink alcohol. If you are breastfeeding,  be very cautious about drinking alcohol. If you are not pregnant and choose to drink alcohol, do not have more than 1 drink per day. One drink is considered to be 12 ounces (355 mL) of beer, 5 ounces (148 mL) of wine, or 1.5 ounces (44 mL) of liquor.  Avoid use of street drugs. Do not share needles with anyone. Ask for help if you need support or instructions about stopping the use of drugs.  High blood pressure causes heart disease and increases the risk of  stroke. Your blood pressure should be checked at least every 1 to 2 years. Ongoing high blood pressure should be treated with medicines if weight loss and exercise do not work.  If you are 75-52 years old, ask your health care provider if you should take aspirin to prevent strokes.  Diabetes screening involves taking a blood sample to check your fasting blood sugar level. This should be done once every 3 years, after age 15, if you are within normal weight and without risk factors for diabetes. Testing should be considered at a younger age or be carried out more frequently if you are overweight and have at least 1 risk factor for diabetes.  Breast cancer screening is essential preventive care for women. You should practice "breast self-awareness." This means understanding the normal appearance and feel of your breasts and may include breast self-examination. Any changes detected, no matter how small, should be reported to a health care provider. Women in their 58s and 30s should have a clinical breast exam (CBE) by a health care provider as part of a regular health exam every 1 to 3 years. After age 16, women should have a CBE every year. Starting at age 53, women should consider having a mammogram (breast X-ray test) every year. Women who have a family history of breast cancer should talk to their health care provider about genetic screening. Women at a high risk of breast cancer should talk to their health care providers about having an MRI and a mammogram every year.  Breast cancer gene (BRCA)-related cancer risk assessment is recommended for women who have family members with BRCA-related cancers. BRCA-related cancers include breast, ovarian, tubal, and peritoneal cancers. Having family members with these cancers may be associated with an increased risk for harmful changes (mutations) in the breast cancer genes BRCA1 and BRCA2. Results of the assessment will determine the need for genetic counseling and  BRCA1 and BRCA2 testing.  Routine pelvic exams to screen for cancer are no longer recommended for nonpregnant women who are considered low risk for cancer of the pelvic organs (ovaries, uterus, and vagina) and who do not have symptoms. Ask your health care provider if a screening pelvic exam is right for you.  If you have had past treatment for cervical cancer or a condition that could lead to cancer, you need Pap tests and screening for cancer for at least 20 years after your treatment. If Pap tests have been discontinued, your risk factors (such as having a new sexual partner) need to be reassessed to determine if screening should be resumed. Some women have medical problems that increase the chance of getting cervical cancer. In these cases, your health care provider may recommend more frequent screening and Pap tests.  The HPV test is an additional test that may be used for cervical cancer screening. The HPV test looks for the virus that can cause the cell changes on the cervix. The cells collected during the Pap test can be  tested for HPV. The HPV test could be used to screen women aged 30 years and older, and should be used in women of any age who have unclear Pap test results. After the age of 30, women should have HPV testing at the same frequency as a Pap test.  Colorectal cancer can be detected and often prevented. Most routine colorectal cancer screening begins at the age of 50 years and continues through age 75 years. However, your health care provider may recommend screening at an earlier age if you have risk factors for colon cancer. On a yearly basis, your health care provider may provide home test kits to check for hidden blood in the stool. Use of a small camera at the end of a tube, to directly examine the colon (sigmoidoscopy or colonoscopy), can detect the earliest forms of colorectal cancer. Talk to your health care provider about this at age 50, when routine screening begins. Direct  exam of the colon should be repeated every 5-10 years through age 75 years, unless early forms of pre-cancerous polyps or small growths are found.  People who are at an increased risk for hepatitis B should be screened for this virus. You are considered at high risk for hepatitis B if:  You were born in a country where hepatitis B occurs often. Talk with your health care provider about which countries are considered high risk.  Your parents were born in a high-risk country and you have not received a shot to protect against hepatitis B (hepatitis B vaccine).  You have HIV or AIDS.  You use needles to inject street drugs.  You live with, or have sex with, someone who has hepatitis B.  You get hemodialysis treatment.  You take certain medicines for conditions like cancer, organ transplantation, and autoimmune conditions.  Hepatitis C blood testing is recommended for all people born from 1945 through 1965 and any individual with known risks for hepatitis C.  Practice safe sex. Use condoms and avoid high-risk sexual practices to reduce the spread of sexually transmitted infections (STIs). STIs include gonorrhea, chlamydia, syphilis, trichomonas, herpes, HPV, and human immunodeficiency virus (HIV). Herpes, HIV, and HPV are viral illnesses that have no cure. They can result in disability, cancer, and death.  You should be screened for sexually transmitted illnesses (STIs) including gonorrhea and chlamydia if:  You are sexually active and are younger than 24 years.  You are older than 24 years and your health care provider tells you that you are at risk for this type of infection.  Your sexual activity has changed since you were last screened and you are at an increased risk for chlamydia or gonorrhea. Ask your health care provider if you are at risk.  If you are at risk of being infected with HIV, it is recommended that you take a prescription medicine daily to prevent HIV infection. This is  called preexposure prophylaxis (PrEP). You are considered at risk if:  You are a heterosexual woman, are sexually active, and are at increased risk for HIV infection.  You take drugs by injection.  You are sexually active with a partner who has HIV.  Talk with your health care provider about whether you are at high risk of being infected with HIV. If you choose to begin PrEP, you should first be tested for HIV. You should then be tested every 3 months for as long as you are taking PrEP.  Osteoporosis is a disease in which the bones lose minerals and strength   with aging. This can result in serious bone fractures or breaks. The risk of osteoporosis can be identified using a bone density scan. Women ages 65 years and over and women at risk for fractures or osteoporosis should discuss screening with their health care providers. Ask your health care provider whether you should take a calcium supplement or vitamin D to reduce the rate of osteoporosis.  Menopause can be associated with physical symptoms and risks. Hormone replacement therapy is available to decrease symptoms and risks. You should talk to your health care provider about whether hormone replacement therapy is right for you.  Use sunscreen. Apply sunscreen liberally and repeatedly throughout the day. You should seek shade when your shadow is shorter than you. Protect yourself by wearing long sleeves, pants, a wide-brimmed hat, and sunglasses year round, whenever you are outdoors.  Once a month, do a whole body skin exam, using a mirror to look at the skin on your back. Tell your health care provider of new moles, moles that have irregular borders, moles that are larger than a pencil eraser, or moles that have changed in shape or color.  Stay current with required vaccines (immunizations).  Influenza vaccine. All adults should be immunized every year.  Tetanus, diphtheria, and acellular pertussis (Td, Tdap) vaccine. Pregnant women should  receive 1 dose of Tdap vaccine during each pregnancy. The dose should be obtained regardless of the length of time since the last dose. Immunization is preferred during the 27th-36th week of gestation. An adult who has not previously received Tdap or who does not know her vaccine status should receive 1 dose of Tdap. This initial dose should be followed by tetanus and diphtheria toxoids (Td) booster doses every 10 years. Adults with an unknown or incomplete history of completing a 3-dose immunization series with Td-containing vaccines should begin or complete a primary immunization series including a Tdap dose. Adults should receive a Td booster every 10 years.  Varicella vaccine. An adult without evidence of immunity to varicella should receive 2 doses or a second dose if she has previously received 1 dose. Pregnant females who do not have evidence of immunity should receive the first dose after pregnancy. This first dose should be obtained before leaving the health care facility. The second dose should be obtained 4-8 weeks after the first dose.  Human papillomavirus (HPV) vaccine. Females aged 13-26 years who have not received the vaccine previously should obtain the 3-dose series. The vaccine is not recommended for use in pregnant females. However, pregnancy testing is not needed before receiving a dose. If a female is found to be pregnant after receiving a dose, no treatment is needed. In that case, the remaining doses should be delayed until after the pregnancy. Immunization is recommended for any person with an immunocompromised condition through the age of 26 years if she did not get any or all doses earlier. During the 3-dose series, the second dose should be obtained 4-8 weeks after the first dose. The third dose should be obtained 24 weeks after the first dose and 16 weeks after the second dose.  Zoster vaccine. One dose is recommended for adults aged 60 years or older unless certain conditions are  present.  Measles, mumps, and rubella (MMR) vaccine. Adults born before 1957 generally are considered immune to measles and mumps. Adults born in 1957 or later should have 1 or more doses of MMR vaccine unless there is a contraindication to the vaccine or there is laboratory evidence of immunity to   each of the three diseases. A routine second dose of MMR vaccine should be obtained at least 28 days after the first dose for students attending postsecondary schools, health care workers, or international travelers. People who received inactivated measles vaccine or an unknown type of measles vaccine during 1963-1967 should receive 2 doses of MMR vaccine. People who received inactivated mumps vaccine or an unknown type of mumps vaccine before 1979 and are at high risk for mumps infection should consider immunization with 2 doses of MMR vaccine. For females of childbearing age, rubella immunity should be determined. If there is no evidence of immunity, females who are not pregnant should be vaccinated. If there is no evidence of immunity, females who are pregnant should delay immunization until after pregnancy. Unvaccinated health care workers born before 1957 who lack laboratory evidence of measles, mumps, or rubella immunity or laboratory confirmation of disease should consider measles and mumps immunization with 2 doses of MMR vaccine or rubella immunization with 1 dose of MMR vaccine.  Pneumococcal 13-valent conjugate (PCV13) vaccine. When indicated, a person who is uncertain of her immunization history and has no record of immunization should receive the PCV13 vaccine. An adult aged 19 years or older who has certain medical conditions and has not been previously immunized should receive 1 dose of PCV13 vaccine. This PCV13 should be followed with a dose of pneumococcal polysaccharide (PPSV23) vaccine. The PPSV23 vaccine dose should be obtained at least 8 weeks after the dose of PCV13 vaccine. An adult aged 19  years or older who has certain medical conditions and previously received 1 or more doses of PPSV23 vaccine should receive 1 dose of PCV13. The PCV13 vaccine dose should be obtained 1 or more years after the last PPSV23 vaccine dose.  Pneumococcal polysaccharide (PPSV23) vaccine. When PCV13 is also indicated, PCV13 should be obtained first. All adults aged 65 years and older should be immunized. An adult younger than age 65 years who has certain medical conditions should be immunized. Any person who resides in a nursing home or long-term care facility should be immunized. An adult smoker should be immunized. People with an immunocompromised condition and certain other conditions should receive both PCV13 and PPSV23 vaccines. People with human immunodeficiency virus (HIV) infection should be immunized as soon as possible after diagnosis. Immunization during chemotherapy or radiation therapy should be avoided. Routine use of PPSV23 vaccine is not recommended for American Indians, [place] Natives, or people younger than 65 years unless there are medical conditions that require PPSV23 vaccine. When indicated, people who have unknown immunization and have no record of immunization should receive PPSV23 vaccine. One-time revaccination 5 years after the first dose of PPSV23 is recommended for people aged 19-64 years who have chronic kidney failure, nephrotic syndrome, asplenia, or immunocompromised conditions. People who received 1-2 doses of PPSV23 before age 65 years should receive another dose of PPSV23 vaccine at age 65 years or later if at least 5 years have passed since the previous dose. Doses of PPSV23 are not needed for people immunized with PPSV23 at or after age 65 years.  Meningococcal vaccine. Adults with asplenia or persistent complement component deficiencies should receive 2 doses of quadrivalent meningococcal conjugate (MenACWY-D) vaccine. The doses should be obtained at least 2 months apart.  Microbiologists working with certain meningococcal bacteria, military recruits, people at risk during an outbreak, and people who travel to or live in countries with a high rate of meningitis should be immunized. A first-year college student up through age   21 years who is living in a residence hall should receive a dose if she did not receive a dose on or after her 16th birthday. Adults who have certain high-risk conditions should receive one or more doses of vaccine.  Hepatitis A vaccine. Adults who wish to be protected from this disease, have certain high-risk conditions, work with hepatitis A-infected animals, work in hepatitis A research labs, or travel to or work in countries with a high rate of hepatitis A should be immunized. Adults who were previously unvaccinated and who anticipate close contact with an international adoptee during the first 60 days after arrival in the [place] from a country with a high rate of hepatitis A should be immunized.  Hepatitis B vaccine. Adults who wish to be protected from this disease, have certain high-risk conditions, may be exposed to blood or other infectious body fluids, are household contacts or sex partners of hepatitis B positive people, are clients or workers in certain care facilities, or travel to or work in countries with a high rate of hepatitis B should be immunized.  Haemophilus influenzae type b (Hib) vaccine. A previously unvaccinated person with asplenia or sickle cell disease or having a scheduled splenectomy should receive 1 dose of Hib vaccine. Regardless of previous immunization, a recipient of a hematopoietic stem cell transplant should receive a 3-dose series 6-12 months after her successful transplant. Hib vaccine is not recommended for adults with HIV infection. Preventive Services / Frequency Ages 64 to 68 years  Blood pressure check.** / Every 1 to 2 years.  Lipid and cholesterol check.** / Every 5 years beginning at age  22.  Clinical breast exam.** / Every 3 years for women in their 88s and 53s.  BRCA-related cancer risk assessment.** / For women who have family members with a BRCA-related cancer (breast, ovarian, tubal, or peritoneal cancers).  Pap test.** / Every 2 years from ages 90 through 51. Every 3 years starting at age 21 through age 56 or 3 with a history of 3 consecutive normal Pap tests.  HPV screening.** / Every 3 years from ages 24 through ages 1 to 46 with a history of 3 consecutive normal Pap tests.  Hepatitis C blood test.** / For any individual with known risks for hepatitis C.  Skin self-exam. / Monthly.  Influenza vaccine. / Every year.  Tetanus, diphtheria, and acellular pertussis (Tdap, Td) vaccine.** / Consult your health care provider. Pregnant women should receive 1 dose of Tdap vaccine during each pregnancy. 1 dose of Td every 10 years.  Varicella vaccine.** / Consult your health care provider. Pregnant females who do not have evidence of immunity should receive the first dose after pregnancy.  HPV vaccine. / 3 doses over 6 months, if 72 and younger. The vaccine is not recommended for use in pregnant females. However, pregnancy testing is not needed before receiving a dose.  Measles, mumps, rubella (MMR) vaccine.** / You need at least 1 dose of MMR if you were born in 1957 or later. You may also need a 2nd dose. For females of childbearing age, rubella immunity should be determined. If there is no evidence of immunity, females who are not pregnant should be vaccinated. If there is no evidence of immunity, females who are pregnant should delay immunization until after pregnancy.  Pneumococcal 13-valent conjugate (PCV13) vaccine.** / Consult your health care provider.  Pneumococcal polysaccharide (PPSV23) vaccine.** / 1 to 2 doses if you smoke cigarettes or if you have certain conditions.  Meningococcal vaccine.** /  1 dose if you are age 19 to 21 years and a first-year college  student living in a residence hall, or have one of several medical conditions, you need to get vaccinated against meningococcal disease. You may also need additional booster doses.  Hepatitis A vaccine.** / Consult your health care provider.  Hepatitis B vaccine.** / Consult your health care provider.  Haemophilus influenzae type b (Hib) vaccine.** / Consult your health care provider. Ages 40 to 64 years  Blood pressure check.** / Every 1 to 2 years.  Lipid and cholesterol check.** / Every 5 years beginning at age 20 years.  Lung cancer screening. / Every year if you are aged 55-80 years and have a 30-pack-year history of smoking and currently smoke or have quit within the past 15 years. Yearly screening is stopped once you have quit smoking for at least 15 years or develop a health problem that would prevent you from having lung cancer treatment.  Clinical breast exam.** / Every year after age 40 years.  BRCA-related cancer risk assessment.** / For women who have family members with a BRCA-related cancer (breast, ovarian, tubal, or peritoneal cancers).  Mammogram.** / Every year beginning at age 40 years and continuing for as long as you are in good health. Consult with your health care provider.  Pap test.** / Every 3 years starting at age 30 years through age 65 or 70 years with a history of 3 consecutive normal Pap tests.  HPV screening.** / Every 3 years from ages 30 years through ages 65 to 70 years with a history of 3 consecutive normal Pap tests.  Fecal occult blood test (FOBT) of stool. / Every year beginning at age 50 years and continuing until age 75 years. You may not need to do this test if you get a colonoscopy every 10 years.  Flexible sigmoidoscopy or colonoscopy.** / Every 5 years for a flexible sigmoidoscopy or every 10 years for a colonoscopy beginning at age 50 years and continuing until age 75 years.  Hepatitis C blood test.** / For all people born from 1945 through  1965 and any individual with known risks for hepatitis C.  Skin self-exam. / Monthly.  Influenza vaccine. / Every year.  Tetanus, diphtheria, and acellular pertussis (Tdap/Td) vaccine.** / Consult your health care provider. Pregnant women should receive 1 dose of Tdap vaccine during each pregnancy. 1 dose of Td every 10 years.  Varicella vaccine.** / Consult your health care provider. Pregnant females who do not have evidence of immunity should receive the first dose after pregnancy.  Zoster vaccine.** / 1 dose for adults aged 60 years or older.  Measles, mumps, rubella (MMR) vaccine.** / You need at least 1 dose of MMR if you were born in 1957 or later. You may also need a 2nd dose. For females of childbearing age, rubella immunity should be determined. If there is no evidence of immunity, females who are not pregnant should be vaccinated. If there is no evidence of immunity, females who are pregnant should delay immunization until after pregnancy.  Pneumococcal 13-valent conjugate (PCV13) vaccine.** / Consult your health care provider.  Pneumococcal polysaccharide (PPSV23) vaccine.** / 1 to 2 doses if you smoke cigarettes or if you have certain conditions.  Meningococcal vaccine.** / Consult your health care provider.  Hepatitis A vaccine.** / Consult your health care provider.  Hepatitis B vaccine.** / Consult your health care provider.  Haemophilus influenzae type b (Hib) vaccine.** / Consult your health care provider. Ages 65   years and over  Blood pressure check.** / Every 1 to 2 years.  Lipid and cholesterol check.** / Every 5 years beginning at age 22 years.  Lung cancer screening. / Every year if you are aged 73-80 years and have a 30-pack-year history of smoking and currently smoke or have quit within the past 15 years. Yearly screening is stopped once you have quit smoking for at least 15 years or develop a health problem that would prevent you from having lung cancer  treatment.  Clinical breast exam.** / Every year after age 4 years.  BRCA-related cancer risk assessment.** / For women who have family members with a BRCA-related cancer (breast, ovarian, tubal, or peritoneal cancers).  Mammogram.** / Every year beginning at age 40 years and continuing for as long as you are in good health. Consult with your health care provider.  Pap test.** / Every 3 years starting at age 9 years through age 34 or 91 years with 3 consecutive normal Pap tests. Testing can be stopped between 65 and 70 years with 3 consecutive normal Pap tests and no abnormal Pap or HPV tests in the past 10 years.  HPV screening.** / Every 3 years from ages 57 years through ages 64 or 45 years with a history of 3 consecutive normal Pap tests. Testing can be stopped between 65 and 70 years with 3 consecutive normal Pap tests and no abnormal Pap or HPV tests in the past 10 years.  Fecal occult blood test (FOBT) of stool. / Every year beginning at age 15 years and continuing until age 17 years. You may not need to do this test if you get a colonoscopy every 10 years.  Flexible sigmoidoscopy or colonoscopy.** / Every 5 years for a flexible sigmoidoscopy or every 10 years for a colonoscopy beginning at age 86 years and continuing until age 71 years.  Hepatitis C blood test.** / For all people born from [birth year] through 1966 and any individual with known risks for hepatitis C.  Osteoporosis screening.** / A one-time screening for women ages 83 years and over and women at risk for fractures or osteoporosis.  Skin self-exam. / Monthly.  Influenza vaccine. / Every year.  Tetanus, diphtheria, and acellular pertussis (Tdap/Td) vaccine.** / 1 dose of Td every 10 years.  Varicella vaccine.** / Consult your health care provider.  Zoster vaccine.** / 1 dose for adults aged 61 years or older.  Pneumococcal 13-valent conjugate (PCV13) vaccine.** / Consult your health care provider.  Pneumococcal  polysaccharide (PPSV23) vaccine.** / 1 dose for all adults aged 28 years and older.  Meningococcal vaccine.** / Consult your health care provider.  Hepatitis A vaccine.** / Consult your health care provider.  Hepatitis B vaccine.** / Consult your health care provider.  Haemophilus influenzae type b (Hib) vaccine.** / Consult your health care provider. ** Family history and personal history of risk and conditions may change your health care provider's recommendations. Document Released: 09/19/2001 Document Revised: 12/08/2013 Document Reviewed: 12/19/2010 [facility] Patient Information 2015 [place], [place]. This information is not intended to replace advice given to you by your health care provider. Make sure you discuss any questions you have with your health care provider.

## 2014-06-05 NOTE — Progress Notes (Signed)
Pre visit review using our clinic review tool, if applicable. No additional management support is needed unless otherwise documented below in the visit note. 

## 2014-06-05 NOTE — Addendum Note (Signed)
Addended by: Peggyann Shoals on: 06/05/2014 05:02 PM   Modules accepted: Orders

## 2014-06-08 ENCOUNTER — Ambulatory Visit (INDEPENDENT_AMBULATORY_CARE_PROVIDER_SITE_OTHER)
Admission: RE | Admit: 2014-06-08 | Discharge: 2014-06-08 | Disposition: A | Discharge status: Home / Self Care | Payer: 59 | Source: Ambulatory Visit | Attending: Family Medicine | Admitting: Family Medicine

## 2014-06-08 ENCOUNTER — Other Ambulatory Visit (INDEPENDENT_AMBULATORY_CARE_PROVIDER_SITE_OTHER): Payer: 59

## 2014-06-08 DIAGNOSIS — E2839 Other primary ovarian failure: Secondary | ICD-10-CM

## 2014-06-08 DIAGNOSIS — Z833 Family history of diabetes mellitus: Secondary | ICD-10-CM

## 2014-06-08 DIAGNOSIS — Z0000 Encounter for general adult medical examination without abnormal findings: Secondary | ICD-10-CM

## 2014-06-08 LAB — CBC WITH DIFFERENTIAL/PLATELET
BASOS ABS: 0 10*3/uL (ref 0.0–0.1)
Basophils Relative: 0.9 % (ref 0.0–3.0)
EOS PCT: 5.7 % — AB (ref 0.0–5.0)
Eosinophils Absolute: 0.3 10*3/uL (ref 0.0–0.7)
HEMATOCRIT: 40.6 % (ref 36.0–46.0)
HEMOGLOBIN: 13.4 g/dL (ref 12.0–15.0)
LYMPHS PCT: 22.1 % (ref 12.0–46.0)
Lymphs Abs: 1.1 10*3/uL (ref 0.7–4.0)
MCHC: 32.9 g/dL (ref 30.0–36.0)
MCV: 101.6 fl — ABNORMAL HIGH (ref 78.0–100.0)
MONOS PCT: 7.6 % (ref 3.0–12.0)
Monocytes Absolute: 0.4 10*3/uL (ref 0.1–1.0)
NEUTROS ABS: 3.1 10*3/uL (ref 1.4–7.7)
Neutrophils Relative %: 63.7 % (ref 43.0–77.0)
Platelets: 312 10*3/uL (ref 150.0–400.0)
RBC: 4 Mil/uL (ref 3.87–5.11)
RDW: 13 % (ref 11.5–15.5)
WBC: 4.9 10*3/uL (ref 4.0–10.5)

## 2014-06-08 LAB — BASIC METABOLIC PANEL
BUN: 13 mg/dL (ref 6–23)
CALCIUM: 9.4 mg/dL (ref 8.4–10.5)
CO2: 25 mEq/L (ref 19–32)
Chloride: 106 mEq/L (ref 96–112)
Creatinine, Ser: 0.8 mg/dL (ref 0.4–1.2)
GFR: 80.25 mL/min (ref >60.00)
GLUCOSE: 97 mg/dL (ref 70–99)
POTASSIUM: 4.8 meq/L (ref 3.5–5.1)
SODIUM: 140 meq/L (ref 135–145)

## 2014-06-08 LAB — LIPID PANEL
Cholesterol: 242 mg/dL — ABNORMAL HIGH (ref 0–200)
HDL: 73.7 mg/dL (ref >39.00)
LDL CALC: 147 mg/dL — AB (ref 0–99)
NONHDL: 168.3
Total CHOL/HDL Ratio: 3
Triglycerides: 108 mg/dL (ref 0.0–149.0)
VLDL: 21.6 mg/dL (ref 0.0–40.0)

## 2014-06-08 LAB — HEPATIC FUNCTION PANEL
ALBUMIN: 3.4 g/dL — AB (ref 3.5–5.2)
ALK PHOS: 60 U/L (ref 39–117)
ALT: 15 U/L (ref 0–35)
AST: 23 U/L (ref 0–37)
BILIRUBIN TOTAL: 0.7 mg/dL (ref 0.2–1.2)
Bilirubin, Direct: 0 mg/dL (ref 0.0–0.3)
Total Protein: 7.3 g/dL (ref 6.0–8.3)

## 2014-06-08 LAB — TSH: TSH: 3.64 u[IU]/mL (ref 0.35–4.50)

## 2014-06-08 LAB — HEMOGLOBIN A1C: Hgb A1c MFr Bld: 5.6 % (ref 4.6–6.5)

## 2014-08-24 ENCOUNTER — Telehealth: Payer: Self-pay | Admitting: Family Medicine

## 2014-08-24 NOTE — Telephone Encounter (Signed)
Caller name: Blaire Relation to pt: self Call back number: [phone] Pharmacy:  Reason for call:   Patient states that she did receive letter regarding lab results but would like a callback. She has questions regarding bone density and labs.

## 2014-08-24 NOTE — Telephone Encounter (Signed)
Reviewed labs and the patient voiced understanding, Patient wants to get something to replace the Colonoscopy. The patient also wanted to know which provider you were going to send her to for the bioidentical hormone testing. Please advise      KP

## 2014-08-24 NOTE — Telephone Encounter (Signed)
The patient stated you gave her a name of  Provider who does the bio identical screening. Please advise     KP

## 2014-08-24 NOTE — Telephone Encounter (Signed)
bioidentical testing is not covered by insurance normally-- you can ask your insurance.   There are naturopathic drs scattered -- [place]----  We can see if she can have cologuard but medicare is only ins that pays for it .

## 2014-08-24 NOTE — Telephone Encounter (Signed)
I think Dr Helane Rima does a little Dr Antionette Fairy, NP

## 2014-08-25 NOTE — Telephone Encounter (Signed)
Patient has been made aware and voiced understanding.     KP 

## 2015-04-06 ENCOUNTER — Ambulatory Visit (HOSPITAL_BASED_OUTPATIENT_CLINIC_OR_DEPARTMENT_OTHER): Payer: 59

## 2015-04-19 ENCOUNTER — Ambulatory Visit (HOSPITAL_BASED_OUTPATIENT_CLINIC_OR_DEPARTMENT_OTHER)
Admission: RE | Admit: 2015-04-19 | Discharge: 2015-04-19 | Disposition: A | Discharge status: Home / Self Care | Payer: Medicare PPO | Source: Ambulatory Visit | Attending: Family Medicine | Admitting: Family Medicine

## 2015-04-19 DIAGNOSIS — Z1231 Encounter for screening mammogram for malignant neoplasm of breast: Secondary | ICD-10-CM | POA: Diagnosis not present

## 2015-04-19 DIAGNOSIS — Z1239 Encounter for other screening for malignant neoplasm of breast: Secondary | ICD-10-CM

## 2015-08-30 ENCOUNTER — Ambulatory Visit (INDEPENDENT_AMBULATORY_CARE_PROVIDER_SITE_OTHER): Payer: Medicare PPO | Admitting: Family Medicine

## 2015-08-30 ENCOUNTER — Encounter: Payer: Self-pay | Admitting: Family Medicine

## 2015-08-30 VITALS — BP 130/70 | HR 94 | Temp 98.2°F | Ht 59.75 in | Wt 177.4 lb

## 2015-08-30 DIAGNOSIS — Z1159 Encounter for screening for other viral diseases: Secondary | ICD-10-CM

## 2015-08-30 DIAGNOSIS — N3010 Interstitial cystitis (chronic) without hematuria: Secondary | ICD-10-CM | POA: Insufficient documentation

## 2015-08-30 DIAGNOSIS — G4700 Insomnia, unspecified: Secondary | ICD-10-CM | POA: Diagnosis not present

## 2015-08-30 DIAGNOSIS — Z114 Encounter for screening for human immunodeficiency virus [HIV]: Secondary | ICD-10-CM

## 2015-08-30 DIAGNOSIS — Z0000 Encounter for general adult medical examination without abnormal findings: Secondary | ICD-10-CM

## 2015-08-30 MED ORDER — SUVOREXANT 10 MG PO TABS: 1.0000 | ORAL_TABLET | Freq: Every evening | ORAL | Status: DC | PRN

## 2015-08-30 NOTE — Patient Instructions (Signed)
Preventive Care for Adults, Female A healthy lifestyle and preventive care can promote health and wellness. Preventive health guidelines for women include the following key practices.  A routine yearly physical is a good way to check with your health care provider about your health and preventive screening. It is a chance to share any concerns and updates on your health and to receive a thorough exam.  Visit your dentist for a routine exam and preventive care every 6 months. Brush your teeth twice a day and floss once a day. Good oral hygiene prevents tooth decay and gum disease.  The frequency of eye exams is based on your age, health, family medical history, use of contact lenses, and other factors. Follow your health care provider's recommendations for frequency of eye exams.  Eat a healthy diet. Foods like vegetables, fruits, whole grains, low-fat dairy products, and lean protein foods contain the nutrients you need without too many calories. Decrease your intake of foods high in solid fats, added sugars, and salt. Eat the right amount of calories for you.Get information about a proper diet from your health care provider, if necessary.  Regular physical exercise is one of the most important things you can do for your health. Most adults should get at least 150 minutes of moderate-intensity exercise (any activity that increases your heart rate and causes you to sweat) each week. In addition, most adults need muscle-strengthening exercises on 2 or more days a week.  Maintain a healthy weight. The body mass index (BMI) is a screening tool to identify possible weight problems. It provides an estimate of body fat based on height and weight. Your health care provider can find your BMI and can help you achieve or maintain a healthy weight.For adults 20 years and older:  A BMI below 18.5 is considered underweight.  A BMI of 18.5 to 24.9 is normal.  A BMI of 25 to 29.9 is considered overweight.  A  BMI of 30 and above is considered obese.  Maintain normal blood lipids and cholesterol levels by exercising and minimizing your intake of saturated fat. Eat a balanced diet with plenty of fruit and vegetables. Blood tests for lipids and cholesterol should begin at age 45 and be repeated every 5 years. If your lipid or cholesterol levels are high, you are over 50, or you are at high risk for heart disease, you may need your cholesterol levels checked more frequently.Ongoing high lipid and cholesterol levels should be treated with medicines if diet and exercise are not working.  If you smoke, find out from your health care provider how to quit. If you do not use tobacco, do not start.  Lung cancer screening is recommended for adults aged 45-80 years who are at high risk for developing lung cancer because of a history of smoking. A yearly low-dose CT scan of the lungs is recommended for people who have at least a 30-pack-year history of smoking and are a current smoker or have quit within the past 15 years. A pack year of smoking is smoking an average of 1 pack of cigarettes a day for 1 year (for example: 1 pack a day for 30 years or 2 packs a day for 15 years). Yearly screening should continue until the smoker has stopped smoking for at least 15 years. Yearly screening should be stopped for people who develop a health problem that would prevent them from having lung cancer treatment.  If you are pregnant, do not drink alcohol. If you are  breastfeeding, be very cautious about drinking alcohol. If you are not pregnant and choose to drink alcohol, do not have more than 1 drink per day. One drink is considered to be 12 ounces (355 mL) of beer, 5 ounces (148 mL) of wine, or 1.5 ounces (44 mL) of liquor.  Avoid use of street drugs. Do not share needles with anyone. Ask for help if you need support or instructions about stopping the use of drugs.  High blood pressure causes heart disease and increases the risk  of stroke. Your blood pressure should be checked at least every 1 to 2 years. Ongoing high blood pressure should be treated with medicines if weight loss and exercise do not work.  If you are 57-1 years old, ask your health care provider if you should take aspirin to prevent strokes.  Diabetes screening is done by taking a blood sample to check your blood glucose level after you have not eaten for a certain period of time (fasting). If you are not overweight and you do not have risk factors for diabetes, you should be screened once every 3 years starting at age 35. If you are overweight or obese and you are 57-66 years of age, you should be screened for diabetes every year as part of your cardiovascular risk assessment.  Breast cancer screening is essential preventive care for women. You should practice "breast self-awareness." This means understanding the normal appearance and feel of your breasts and may include breast self-examination. Any changes detected, no matter how small, should be reported to a health care provider. Women in their 87s and 30s should have a clinical breast exam (CBE) by a health care provider as part of a regular health exam every 1 to 3 years. After age 72, women should have a CBE every year. Starting at age 70, women should consider having a mammogram (breast X-ray test) every year. Women who have a family history of breast cancer should talk to their health care provider about genetic screening. Women at a high risk of breast cancer should talk to their health care providers about having an MRI and a mammogram every year.  Breast cancer gene (BRCA)-related cancer risk assessment is recommended for women who have family members with BRCA-related cancers. BRCA-related cancers include breast, ovarian, tubal, and peritoneal cancers. Having family members with these cancers may be associated with an increased risk for harmful changes (mutations) in the breast cancer genes BRCA1 and  BRCA2. Results of the assessment will determine the need for genetic counseling and BRCA1 and BRCA2 testing.  Your health care provider may recommend that you be screened regularly for cancer of the pelvic organs (ovaries, uterus, and vagina). This screening involves a pelvic examination, including checking for microscopic changes to the surface of your cervix (Pap test). You may be encouraged to have this screening done every 3 years, beginning at age 48.  For women ages 72-65, health care providers may recommend pelvic exams and Pap testing every 3 years, or they may recommend the Pap and pelvic exam, combined with testing for human papilloma virus (HPV), every 5 years. Some types of HPV increase your risk of cervical cancer. Testing for HPV may also be done on women of any age with unclear Pap test results.  Other health care providers may not recommend any screening for nonpregnant women who are considered low risk for pelvic cancer and who do not have symptoms. Ask your health care provider if a screening pelvic exam is right for  you.  If you have had past treatment for cervical cancer or a condition that could lead to cancer, you need Pap tests and screening for cancer for at least 20 years after your treatment. If Pap tests have been discontinued, your risk factors (such as having a new sexual partner) need to be reassessed to determine if screening should resume. Some women have medical problems that increase the chance of getting cervical cancer. In these cases, your health care provider may recommend more frequent screening and Pap tests.  Colorectal cancer can be detected and often prevented. Most routine colorectal cancer screening begins at the age of 62 years and continues through age 64 years. However, your health care provider may recommend screening at an earlier age if you have risk factors for colon cancer. On a yearly basis, your health care provider may provide home test kits to check  for hidden blood in the stool. Use of a small camera at the end of a tube, to directly examine the colon (sigmoidoscopy or colonoscopy), can detect the earliest forms of colorectal cancer. Talk to your health care provider about this at age 31, when routine screening begins. Direct exam of the colon should be repeated every 5-10 years through age 53 years, unless early forms of precancerous polyps or small growths are found.  People who are at an increased risk for hepatitis B should be screened for this virus. You are considered at high risk for hepatitis B if:  You were born in a country where hepatitis B occurs often. Talk with your health care provider about which countries are considered high risk.  Your parents were born in a high-risk country and you have not received a shot to protect against hepatitis B (hepatitis B vaccine).  You have HIV or AIDS.  You use needles to inject street drugs.  You live with, or have sex with, someone who has hepatitis B.  You get hemodialysis treatment.  You take certain medicines for conditions like cancer, organ transplantation, and autoimmune conditions.  Hepatitis C blood testing is recommended for all people born from [birth year] through 1965 and any individual with known risks for hepatitis C.  Practice safe sex. Use condoms and avoid high-risk sexual practices to reduce the spread of sexually transmitted infections (STIs). STIs include gonorrhea, chlamydia, syphilis, trichomonas, herpes, HPV, and human immunodeficiency virus (HIV). Herpes, HIV, and HPV are viral illnesses that have no cure. They can result in disability, cancer, and death.  You should be screened for sexually transmitted illnesses (STIs) including gonorrhea and chlamydia if:  You are sexually active and are younger than 24 years.  You are older than 24 years and your health care provider tells you that you are at risk for this type of infection.  Your sexual activity has changed  since you were last screened and you are at an increased risk for chlamydia or gonorrhea. Ask your health care provider if you are at risk.  If you are at risk of being infected with HIV, it is recommended that you take a prescription medicine daily to prevent HIV infection. This is called preexposure prophylaxis (PrEP). You are considered at risk if:  You are sexually active and do not regularly use condoms or know the HIV status of your partner(s).  You take drugs by injection.  You are sexually active with a partner who has HIV.  Talk with your health care provider about whether you are at high risk of being infected with HIV. If  you choose to begin PrEP, you should first be tested for HIV. You should then be tested every 3 months for as long as you are taking PrEP.  Osteoporosis is a disease in which the bones lose minerals and strength with aging. This can result in serious bone fractures or breaks. The risk of osteoporosis can be identified using a bone density scan. Women ages 67 years and over and women at risk for fractures or osteoporosis should discuss screening with their health care providers. Ask your health care provider whether you should take a calcium supplement or vitamin D to reduce the rate of osteoporosis.  Menopause can be associated with physical symptoms and risks. Hormone replacement therapy is available to decrease symptoms and risks. You should talk to your health care provider about whether hormone replacement therapy is right for you.  Use sunscreen. Apply sunscreen liberally and repeatedly throughout the day. You should seek shade when your shadow is shorter than you. Protect yourself by wearing long sleeves, pants, a wide-brimmed hat, and sunglasses year round, whenever you are outdoors.  Once a month, do a whole body skin exam, using a mirror to look at the skin on your back. Tell your health care provider of new moles, moles that have irregular borders, moles that  are larger than a pencil eraser, or moles that have changed in shape or color.  Stay current with required vaccines (immunizations).  Influenza vaccine. All adults should be immunized every year.  Tetanus, diphtheria, and acellular pertussis (Td, Tdap) vaccine. Pregnant women should receive 1 dose of Tdap vaccine during each pregnancy. The dose should be obtained regardless of the length of time since the last dose. Immunization is preferred during the 27th-36th week of gestation. An adult who has not previously received Tdap or who does not know her vaccine status should receive 1 dose of Tdap. This initial dose should be followed by tetanus and diphtheria toxoids (Td) booster doses every 10 years. Adults with an unknown or incomplete history of completing a 3-dose immunization series with Td-containing vaccines should begin or complete a primary immunization series including a Tdap dose. Adults should receive a Td booster every 10 years.  Varicella vaccine. An adult without evidence of immunity to varicella should receive 2 doses or a second dose if she has previously received 1 dose. Pregnant females who do not have evidence of immunity should receive the first dose after pregnancy. This first dose should be obtained before leaving the health care facility. The second dose should be obtained 4-8 weeks after the first dose.  Human papillomavirus (HPV) vaccine. Females aged 13-26 years who have not received the vaccine previously should obtain the 3-dose series. The vaccine is not recommended for use in pregnant females. However, pregnancy testing is not needed before receiving a dose. If a female is found to be pregnant after receiving a dose, no treatment is needed. In that case, the remaining doses should be delayed until after the pregnancy. Immunization is recommended for any person with an immunocompromised condition through the age of 61 years if she did not get any or all doses earlier. During the  3-dose series, the second dose should be obtained 4-8 weeks after the first dose. The third dose should be obtained 24 weeks after the first dose and 16 weeks after the second dose.  Zoster vaccine. One dose is recommended for adults aged 30 years or older unless certain conditions are present.  Measles, mumps, and rubella (MMR) vaccine. Adults born  before 1957 generally are considered immune to measles and mumps. Adults born in [birth year] or later should have 1 or more doses of MMR vaccine unless there is a contraindication to the vaccine or there is laboratory evidence of immunity to each of the three diseases. A routine second dose of MMR vaccine should be obtained at least 28 days after the first dose for students attending postsecondary schools, health care workers, or international travelers. People who received inactivated measles vaccine or an unknown type of measles vaccine during 1963-1967 should receive 2 doses of MMR vaccine. People who received inactivated mumps vaccine or an unknown type of mumps vaccine before 1979 and are at high risk for mumps infection should consider immunization with 2 doses of MMR vaccine. For females of childbearing age, rubella immunity should be determined. If there is no evidence of immunity, females who are not pregnant should be vaccinated. If there is no evidence of immunity, females who are pregnant should delay immunization until after pregnancy. Unvaccinated health care workers born before [birth year] who lack laboratory evidence of measles, mumps, or rubella immunity or laboratory confirmation of disease should consider measles and mumps immunization with 2 doses of MMR vaccine or rubella immunization with 1 dose of MMR vaccine.  Pneumococcal 13-valent conjugate (PCV13) vaccine. When indicated, a person who is uncertain of his immunization history and has no record of immunization should receive the PCV13 vaccine. All adults 72 years of age and older should receive this  vaccine. An adult aged 38 years or older who has certain medical conditions and has not been previously immunized should receive 1 dose of PCV13 vaccine. This PCV13 should be followed with a dose of pneumococcal polysaccharide (PPSV23) vaccine. Adults who are at high risk for pneumococcal disease should obtain the PPSV23 vaccine at least 8 weeks after the dose of PCV13 vaccine. Adults older than 61 years of age who have normal immune system function should obtain the PPSV23 vaccine dose at least 1 year after the dose of PCV13 vaccine.  Pneumococcal polysaccharide (PPSV23) vaccine. When PCV13 is also indicated, PCV13 should be obtained first. All adults aged 17 years and older should be immunized. An adult younger than age 61 years who has certain medical conditions should be immunized. Any person who resides in a nursing home or long-term care facility should be immunized. An adult smoker should be immunized. People with an immunocompromised condition and certain other conditions should receive both PCV13 and PPSV23 vaccines. People with human immunodeficiency virus (HIV) infection should be immunized as soon as possible after diagnosis. Immunization during chemotherapy or radiation therapy should be avoided. Routine use of PPSV23 vaccine is not recommended for American Indians, [place] Natives, or people younger than 65 years unless there are medical conditions that require PPSV23 vaccine. When indicated, people who have unknown immunization and have no record of immunization should receive PPSV23 vaccine. One-time revaccination 5 years after the first dose of PPSV23 is recommended for people aged 19-64 years who have chronic kidney failure, nephrotic syndrome, asplenia, or immunocompromised conditions. People who received 1-2 doses of PPSV23 before age 34 years should receive another dose of PPSV23 vaccine at age 27 years or later if at least 5 years have passed since the previous dose. Doses of PPSV23 are not  needed for people immunized with PPSV23 at or after age 89 years.  Meningococcal vaccine. Adults with asplenia or persistent complement component deficiencies should receive 2 doses of quadrivalent meningococcal conjugate (MenACWY-D) vaccine. The doses should be obtained  at least 2 months apart. Microbiologists working with certain meningococcal bacteria, [employer] recruits, people at risk during an outbreak, and people who travel to or live in countries with a high rate of meningitis should be immunized. A first-year college student up through age 48 years who is living in a residence hall should receive a dose if she did not receive a dose on or after her 16th birthday. Adults who have certain high-risk conditions should receive one or more doses of vaccine.  Hepatitis A vaccine. Adults who wish to be protected from this disease, have certain high-risk conditions, work with hepatitis A-infected animals, work in hepatitis A research labs, or travel to or work in countries with a high rate of hepatitis A should be immunized. Adults who were previously unvaccinated and who anticipate close contact with an international adoptee during the first 60 days after arrival in the [place] from a country with a high rate of hepatitis A should be immunized.  Hepatitis B vaccine. Adults who wish to be protected from this disease, have certain high-risk conditions, may be exposed to blood or other infectious body fluids, are household contacts or sex partners of hepatitis B positive people, are clients or workers in certain care facilities, or travel to or work in countries with a high rate of hepatitis B should be immunized.  Haemophilus influenzae type b (Hib) vaccine. A previously unvaccinated person with asplenia or sickle cell disease or having a scheduled splenectomy should receive 1 dose of Hib vaccine. Regardless of previous immunization, a recipient of a hematopoietic stem cell transplant should receive a  3-dose series 6-12 months after her successful transplant. Hib vaccine is not recommended for adults with HIV infection. Preventive Services / Frequency Ages 27 to 7 years  Blood pressure check.** / Every 3-5 years.  Lipid and cholesterol check.** / Every 5 years beginning at age 21.  Clinical breast exam.** / Every 3 years for women in their 67s and 42s.  BRCA-related cancer risk assessment.** / For women who have family members with a BRCA-related cancer (breast, ovarian, tubal, or peritoneal cancers).  Pap test.** / Every 2 years from ages 70 through 48. Every 3 years starting at age 85 through age 38 or 23 with a history of 3 consecutive normal Pap tests.  HPV screening.** / Every 3 years from ages 20 through ages 74 to 1 with a history of 3 consecutive normal Pap tests.  Hepatitis C blood test.** / For any individual with known risks for hepatitis C.  Skin self-exam. / Monthly.  Influenza vaccine. / Every year.  Tetanus, diphtheria, and acellular pertussis (Tdap, Td) vaccine.** / Consult your health care provider. Pregnant women should receive 1 dose of Tdap vaccine during each pregnancy. 1 dose of Td every 10 years.  Varicella vaccine.** / Consult your health care provider. Pregnant females who do not have evidence of immunity should receive the first dose after pregnancy.  HPV vaccine. / 3 doses over 6 months, if 56 and younger. The vaccine is not recommended for use in pregnant females. However, pregnancy testing is not needed before receiving a dose.  Measles, mumps, rubella (MMR) vaccine.** / You need at least 1 dose of MMR if you were born in 1957 or later. You may also need a 2nd dose. For females of childbearing age, rubella immunity should be determined. If there is no evidence of immunity, females who are not pregnant should be vaccinated. If there is no evidence of immunity, females who are  pregnant should delay immunization until after pregnancy.  Pneumococcal  13-valent conjugate (PCV13) vaccine.** / Consult your health care provider.  Pneumococcal polysaccharide (PPSV23) vaccine.** / 1 to 2 doses if you smoke cigarettes or if you have certain conditions.  Meningococcal vaccine.** / 1 dose if you are age 68 to 8 years and a Market researcher living in a residence hall, or have one of several medical conditions, you need to get vaccinated against meningococcal disease. You may also need additional booster doses.  Hepatitis A vaccine.** / Consult your health care provider.  Hepatitis B vaccine.** / Consult your health care provider.  Haemophilus influenzae type b (Hib) vaccine.** / Consult your health care provider. Ages 7 to 53 years  Blood pressure check.** / Every year.  Lipid and cholesterol check.** / Every 5 years beginning at age 25 years.  Lung cancer screening. / Every year if you are aged 11-80 years and have a 30-pack-year history of smoking and currently smoke or have quit within the past 15 years. Yearly screening is stopped once you have quit smoking for at least 15 years or develop a health problem that would prevent you from having lung cancer treatment.  Clinical breast exam.** / Every year after age 48 years.  BRCA-related cancer risk assessment.** / For women who have family members with a BRCA-related cancer (breast, ovarian, tubal, or peritoneal cancers).  Mammogram.** / Every year beginning at age 41 years and continuing for as long as you are in good health. Consult with your health care provider.  Pap test.** / Every 3 years starting at age 65 years through age 37 or 70 years with a history of 3 consecutive normal Pap tests.  HPV screening.** / Every 3 years from ages 72 years through ages 60 to 40 years with a history of 3 consecutive normal Pap tests.  Fecal occult blood test (FOBT) of stool. / Every year beginning at age 21 years and continuing until age 5 years. You may not need to do this test if you get  a colonoscopy every 10 years.  Flexible sigmoidoscopy or colonoscopy.** / Every 5 years for a flexible sigmoidoscopy or every 10 years for a colonoscopy beginning at age 35 years and continuing until age 48 years.  Hepatitis C blood test.** / For all people born from [birth year] through 1966 and any individual with known risks for hepatitis C.  Skin self-exam. / Monthly.  Influenza vaccine. / Every year.  Tetanus, diphtheria, and acellular pertussis (Tdap/Td) vaccine.** / Consult your health care provider. Pregnant women should receive 1 dose of Tdap vaccine during each pregnancy. 1 dose of Td every 10 years.  Varicella vaccine.** / Consult your health care provider. Pregnant females who do not have evidence of immunity should receive the first dose after pregnancy.  Zoster vaccine.** / 1 dose for adults aged 30 years or older.  Measles, mumps, rubella (MMR) vaccine.** / You need at least 1 dose of MMR if you were born in 1957 or later. You may also need a second dose. For females of childbearing age, rubella immunity should be determined. If there is no evidence of immunity, females who are not pregnant should be vaccinated. If there is no evidence of immunity, females who are pregnant should delay immunization until after pregnancy.  Pneumococcal 13-valent conjugate (PCV13) vaccine.** / Consult your health care provider.  Pneumococcal polysaccharide (PPSV23) vaccine.** / 1 to 2 doses if you smoke cigarettes or if you have certain conditions.  Meningococcal vaccine.** /  Consult your health care provider.  Hepatitis A vaccine.** / Consult your health care provider.  Hepatitis B vaccine.** / Consult your health care provider.  Haemophilus influenzae type b (Hib) vaccine.** / Consult your health care provider. Ages 64 years and over  Blood pressure check.** / Every year.  Lipid and cholesterol check.** / Every 5 years beginning at age 23 years.  Lung cancer screening. / Every year if you  are aged 16-80 years and have a 30-pack-year history of smoking and currently smoke or have quit within the past 15 years. Yearly screening is stopped once you have quit smoking for at least 15 years or develop a health problem that would prevent you from having lung cancer treatment.  Clinical breast exam.** / Every year after age 74 years.  BRCA-related cancer risk assessment.** / For women who have family members with a BRCA-related cancer (breast, ovarian, tubal, or peritoneal cancers).  Mammogram.** / Every year beginning at age 44 years and continuing for as long as you are in good health. Consult with your health care provider.  Pap test.** / Every 3 years starting at age 58 years through age 22 or 39 years with 3 consecutive normal Pap tests. Testing can be stopped between 65 and 70 years with 3 consecutive normal Pap tests and no abnormal Pap or HPV tests in the past 10 years.  HPV screening.** / Every 3 years from ages 64 years through ages 70 or 61 years with a history of 3 consecutive normal Pap tests. Testing can be stopped between 65 and 70 years with 3 consecutive normal Pap tests and no abnormal Pap or HPV tests in the past 10 years.  Fecal occult blood test (FOBT) of stool. / Every year beginning at age 40 years and continuing until age 27 years. You may not need to do this test if you get a colonoscopy every 10 years.  Flexible sigmoidoscopy or colonoscopy.** / Every 5 years for a flexible sigmoidoscopy or every 10 years for a colonoscopy beginning at age 7 years and continuing until age 32 years.  Hepatitis C blood test.** / For all people born from [birth year] through 1966 and any individual with known risks for hepatitis C.  Osteoporosis screening.** / A one-time screening for women ages 30 years and over and women at risk for fractures or osteoporosis.  Skin self-exam. / Monthly.  Influenza vaccine. / Every year.  Tetanus, diphtheria, and acellular pertussis (Tdap/Td)  vaccine.** / 1 dose of Td every 10 years.  Varicella vaccine.** / Consult your health care provider.  Zoster vaccine.** / 1 dose for adults aged 35 years or older.  Pneumococcal 13-valent conjugate (PCV13) vaccine.** / Consult your health care provider.  Pneumococcal polysaccharide (PPSV23) vaccine.** / 1 dose for all adults aged 46 years and older.  Meningococcal vaccine.** / Consult your health care provider.  Hepatitis A vaccine.** / Consult your health care provider.  Hepatitis B vaccine.** / Consult your health care provider.  Haemophilus influenzae type b (Hib) vaccine.** / Consult your health care provider. ** Family history and personal history of risk and conditions may change your health care provider's recommendations.   This information is not intended to replace advice given to you by your health care provider. Make sure you discuss any questions you have with your health care provider.   Document Released: 09/19/2001 Document Revised: 08/14/2014 Document Reviewed: 12/19/2010 Elsevier Interactive Patient Education [employer].

## 2015-08-30 NOTE — Assessment & Plan Note (Signed)
Per u rology 

## 2015-08-30 NOTE — Progress Notes (Signed)
Pre visit review using our clinic review tool, if applicable. No additional management support is needed unless otherwise documented below in the visit note. 

## 2015-08-30 NOTE — Progress Notes (Signed)
Subjective:     Tanya Griffith is a 60 y.o. female and is here for a comprehensive physical exam. The patient reports problems - insomnia,  probably from ICS but pt is seeing urology for this as well. .  Social History   Social History  . Marital Status: Married    Spouse Name: N/A  . Number of Children: N/A  . Years of Education: N/A   Occupational History  . Not on file.   Social History Main Topics  . Smoking status: Never Smoker   . Smokeless tobacco: Not on file  . Alcohol Use: No  . Drug Use: No  . Sexual Activity: Not on file   Other Topics Concern  . Not on file   Social History Narrative   Health Maintenance  Topic Date Due  . Hepatitis C Screening  1956-01-14  . HIV Screening  02/02/1970  . COLONOSCOPY  02/02/2005  . INFLUENZA VACCINE  08/29/2016 (Originally 03/08/2015)  . ZOSTAVAX  08/29/2016 (Originally 02/03/2015)  . TETANUS/TDAP  08/29/2016 (Originally 02/02/1974)  . MAMMOGRAM  04/18/2017    The following portions of the patient's history were reviewed and updated as appropriate:  She  has a past medical history of IC (interstitial cystitis); Interstitial cystitis; Osteoarthritis (arthritis due to wear and tear of joints); Back problem; Chicken pox; Abdominal wall ulcer ([place]); Environmental allergies; and Mitral valve prolapse. She  does not have any pertinent problems on file. She  has past surgical history that includes Abdominal hysterectomy; Tonsillectomy; Bladder surgery; Oophorectomy; Knee surgery (Right); Bladder surgery; Nasal septum surgery; and Cesarean section. Her family history includes Alzheimer's disease in her mother; Arthritis in her brother, maternal aunt, maternal grandfather, and mother; Cancer in her sister; Dementia in her mother; Diabetes in her brother and maternal aunt; Heart attack in her paternal grandfather; Hyperlipidemia in her mother; Hypertension in her brother; Kidney disease (age of onset: 63) in her mother; Transient  ischemic attack in her maternal aunt and mother. She  reports that she has never smoked. She does not have any smokeless tobacco history on file. She reports that she does not drink alcohol or use illicit drugs. She has a current medication list which includes the following prescription(s): aspirin, calcium-magnesium, flax seed oil, msm, multiple vitamins-minerals, fish oil, probiotic product, and suvorexant. Current Outpatient Prescriptions on File Prior to Visit  Medication Sig Dispense Refill  . aspirin 325 MG tablet Take 650 mg by mouth daily.     . Calcium-Magnesium (CAL-MAG PO) Take 1 tablet by mouth.    . Flaxseed, Linseed, (FLAX SEED OIL) 1000 MG CAPS Take by mouth.    . Methylsulfonylmethane (MSM) 1000 MG TABS Take by mouth.    . Multiple Vitamins-Minerals (CENTRUM SILVER PO) Take by mouth.    . Omega-3 Fatty Acids (FISH OIL) 1000 MG CAPS Take by mouth.     No current facility-administered medications on file prior to visit.   She is allergic to other and sulfa antibiotics..  Review of Systems Review of Systems  Constitutional: Negative for activity change, appetite change and fatigue.  HENT: Negative for hearing loss, congestion, tinnitus and ear discharge.  dentist q30mEyes: Negative for visual disturbance (see optho q1y -- vision corrected to 20/20 with glasses).  Respiratory: Negative for cough, chest tightness and shortness of breath.   Cardiovascular: Negative for chest pain, palpitations and leg swelling.  Gastrointestinal: Negative for abdominal pain, diarrhea, constipation and abdominal distention.  Genitourinary: Negative for urgency, frequency, decreased urine volume and difficulty  urinating.  Musculoskeletal: Negative for back pain, arthralgias and gait problem.  Skin: Negative for color change, pallor and rash.  Neurological: Negative for dizziness, light-headedness, numbness and headaches.  Hematological: Negative for adenopathy. Does not bruise/bleed easily.   Psychiatric/Behavioral: Negative for suicidal ideas, confusion, sleep disturbance, self-injury, dysphoric mood, decreased concentration and agitation.       Objective:      BP 130/70 mmHg  Pulse 94  Temp(Src) 98.2 F (36.8 C) (Oral)  Ht 4' 11.75" (1.518 m)  Wt 177 lb 6.4 oz (80.468 kg)  BMI 34.92 kg/m2  SpO2 98% General appearance: alert, cooperative, appears stated age and no distress Head: Normocephalic, without obvious abnormality, atraumatic Eyes: conjunctivae/corneas clear. PERRL, EOM's intact. Fundi benign. Ears: normal TM's and external ear canals both ears Nose: Nares normal. Septum midline. Mucosa normal. No drainage or sinus tenderness. Throat: lips, mucosa, and tongue normal; teeth and gums normal Neck: no adenopathy, no carotid bruit, no JVD, supple, symmetrical, trachea midline and thyroid not enlarged, symmetric, no tenderness/mass/nodules Back: symmetric, no curvature. ROM normal. No CVA tenderness. Lungs: clear to auscultation bilaterally Breasts: normal appearance, no masses or tenderness Heart: regular rate and rhythm, S1, S2 normal, no murmur, click, rub or gallop Abdomen: soft, non-tender; bowel sounds normal; no masses,  no organomegaly Pelvic: deferred Extremities: extremities normal, atraumatic, no cyanosis or edema Pulses: 2+ and symmetric Skin: Skin color, texture, turgor normal. No rashes or lesions Lymph nodes: Cervical, supraclavicular, and axillary nodes normal. Neurologic: Alert and oriented X 3, normal strength and tone. Normal symmetric reflexes. Normal coordination and gait Psych- no depression, no anxiety   Assessment:    Healthy female exam.      Plan:    ghm utd Check labs See After Visit Summary for Counseling Recommendations    1. Insomnia   - Suvorexant (BELSOMRA) 10 MG TABS; Take 1 tablet by mouth at bedtime as needed.  Dispense: 10 tablet; Refill: 5 - Ambulatory referral to Neurology  2. Preventative health care   - HIV  antibody; Future - TSH; Future - POCT urinalysis dipstick; Future - Lipid panel; Future - CBC with Differential/Platelet; Future - Comp Met (CMET); Future - Hepatitis C antibody; Future  3. Need for hepatitis C screening test    - Hepatitis C antibody; Future  4. Encounter for screening for HIV   - HIV antibody; Future

## 2015-09-07 ENCOUNTER — Other Ambulatory Visit (INDEPENDENT_AMBULATORY_CARE_PROVIDER_SITE_OTHER): Payer: Medicare PPO

## 2015-09-07 DIAGNOSIS — Z0000 Encounter for general adult medical examination without abnormal findings: Secondary | ICD-10-CM

## 2015-09-07 DIAGNOSIS — Z1159 Encounter for screening for other viral diseases: Secondary | ICD-10-CM

## 2015-09-07 DIAGNOSIS — Z114 Encounter for screening for human immunodeficiency virus [HIV]: Secondary | ICD-10-CM

## 2015-09-07 LAB — CBC WITH DIFFERENTIAL/PLATELET
BASOS PCT: 0.9 % (ref 0.0–3.0)
Basophils Absolute: 0.1 10*3/uL (ref 0.0–0.1)
EOS ABS: 0.4 10*3/uL (ref 0.0–0.7)
Eosinophils Relative: 7.3 % — ABNORMAL HIGH (ref 0.0–5.0)
HEMATOCRIT: 39.3 % (ref 36.0–46.0)
Hemoglobin: 12.9 g/dL (ref 12.0–15.0)
LYMPHS ABS: 1.7 10*3/uL (ref 0.7–4.0)
LYMPHS PCT: 28.7 % (ref 12.0–46.0)
MCHC: 32.9 g/dL (ref 30.0–36.0)
MCV: 99.5 fl (ref 78.0–100.0)
Monocytes Absolute: 0.3 10*3/uL (ref 0.1–1.0)
Monocytes Relative: 5.8 % (ref 3.0–12.0)
NEUTROS ABS: 3.5 10*3/uL (ref 1.4–7.7)
Neutrophils Relative %: 57.3 % (ref 43.0–77.0)
PLATELETS: 307 10*3/uL (ref 150.0–400.0)
RBC: 3.96 Mil/uL (ref 3.87–5.11)
RDW: 13.1 % (ref 11.5–15.5)
WBC: 6 10*3/uL (ref 4.0–10.5)

## 2015-09-07 LAB — POCT URINALYSIS DIPSTICK
Bilirubin, UA: NEGATIVE
GLUCOSE UA: NEGATIVE
Ketones, UA: NEGATIVE
LEUKOCYTES UA: NEGATIVE
NITRITE UA: NEGATIVE
Protein, UA: NEGATIVE
RBC UA: NEGATIVE
Spec Grav, UA: 1.02
UROBILINOGEN UA: 0.2
pH, UA: 6

## 2015-09-07 LAB — COMPREHENSIVE METABOLIC PANEL
ALT: 14 U/L (ref 0–35)
AST: 20 U/L (ref 0–37)
Albumin: 3.9 g/dL (ref 3.5–5.2)
Alkaline Phosphatase: 62 U/L (ref 39–117)
BUN: 16 mg/dL (ref 6–23)
CALCIUM: 9.4 mg/dL (ref 8.4–10.5)
CHLORIDE: 105 meq/L (ref 96–112)
CO2: 29 meq/L (ref 19–32)
Creatinine, Ser: 0.73 mg/dL (ref 0.40–1.20)
GFR: 86.26 mL/min (ref >60.00)
Glucose, Bld: 101 mg/dL — ABNORMAL HIGH (ref 70–99)
Potassium: 4.3 mEq/L (ref 3.5–5.1)
Sodium: 140 mEq/L (ref 135–145)
Total Bilirubin: 0.4 mg/dL (ref 0.2–1.2)
Total Protein: 7.1 g/dL (ref 6.0–8.3)

## 2015-09-07 LAB — LIPID PANEL
CHOLESTEROL: 194 mg/dL (ref 0–200)
HDL: 53.5 mg/dL (ref >39.00)
LDL CALC: 106 mg/dL — AB (ref 0–99)
NonHDL: 140.94
TRIGLYCERIDES: 175 mg/dL — AB (ref 0.0–149.0)
Total CHOL/HDL Ratio: 4
VLDL: 35 mg/dL (ref 0.0–40.0)

## 2015-09-07 LAB — HEPATITIS C ANTIBODY: HCV Ab: NEGATIVE

## 2015-09-07 LAB — TSH: TSH: 4.07 u[IU]/mL (ref 0.35–4.50)

## 2015-09-07 LAB — HIV ANTIBODY (ROUTINE TESTING W REFLEX): HIV: NONREACTIVE

## 2015-09-27 ENCOUNTER — Encounter: Payer: Self-pay | Admitting: Neurology

## 2015-09-27 ENCOUNTER — Ambulatory Visit (INDEPENDENT_AMBULATORY_CARE_PROVIDER_SITE_OTHER): Payer: Medicare PPO | Admitting: Neurology

## 2015-09-27 VITALS — BP 144/84 | HR 70 | Resp 20 | Ht 60.0 in | Wt 175.0 lb

## 2015-09-27 DIAGNOSIS — G4700 Insomnia, unspecified: Secondary | ICD-10-CM

## 2015-09-27 DIAGNOSIS — N3011 Interstitial cystitis (chronic) with hematuria: Secondary | ICD-10-CM

## 2015-09-27 DIAGNOSIS — R0683 Snoring: Secondary | ICD-10-CM

## 2015-09-27 DIAGNOSIS — G4701 Insomnia due to medical condition: Secondary | ICD-10-CM

## 2015-09-27 DIAGNOSIS — G8929 Other chronic pain: Secondary | ICD-10-CM | POA: Diagnosis not present

## 2015-09-27 MED ORDER — SUVOREXANT 10 MG PO TABS: 1.0000 | ORAL_TABLET | Freq: Every evening | ORAL | Status: DC | PRN

## 2015-09-27 NOTE — Progress Notes (Signed)
[facility]EP MED[facility]INE CLINIC   Provider:  Larey Seat, M D  Referring Provider: Rosalita Chessman, DO Primary Care Physician:  Garnet Koyanagi, DO  Chief Complaint  Patient presents with  . New Patient (Initial Visit)    insomnia, some snoring, rm 10, alone    HPI:  Tanya Griffith is a 60 y.o. female , seen here as a referral from Dr. Etter Sjogren for a sleep consultation, in a patient with snoring .  Tanya Griffith reports that she has multiple medical problems but some also culminate in poor sleep. She has trouble with insomnia. She is snoring. She is status post tonsillectomy and deviated septum plasty. She is also concerned that her sleep problem may affect her memory and told me that her mother had Alzheimer's dementia. 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 This reduced bladder volume affects the frequency with which she has to urinate. It begun in her twenties. She was on many medications that caused her to be sleepy. She felt cloudy. She later used Ambien and elavil and had trouble to wean off. She is not sleeping poorly but she is not " hung over " the following day.  The pain of the  interstitial cystitis is her main problem to go to sleep and stay asleep. The patient has started to nap less, but she feels refreshed after 20-30 minutes as no nocturnal sleep can. She averages only 4 hours of sleep. She is married for 38 years , and noted that she has memory lapses.  The patient has chronic insomnia and pain.   Sleep habits are as follows: Each night can be different, she usually goes to bed bewteen 11 and 12 midnight. She gets up at 8 AM.  The bed room is cool , quiet and dark. She runs a fan in the background. The patient sleeps on multiple pillows, on the back or on her side. Once asleep she usually has to go to the bathroom every 20 minutes. If asleep, the maximum time of sleep en bloc is 55 minutes.  The interstitial cystitis controls all aspect of her life.  Many nights she has 3 hours total sleep. She exercises in the day by walking the dogs.   Sleep medical history and family sleep history:  History of nocturia, polyuria for over 45 years.  Social history:  One daughter , married, 2 years.   Review of Systems: Out of a complete 14 system review, the patient complains of only the following symptoms, and all other reviewed systems are negative.  Epworth score 17 , Fatigue severity score 53  , depression score 2   Social History   Social History  . Marital Status: Married    Spouse Name: N/A  . Number of Children: N/A  . Years of Education: N/A   Occupational History  . Not on file.   Social History Main Topics  . Smoking status: Never Smoker   . Smokeless tobacco: Not on file  . Alcohol Use: No  . Drug Use: No  . Sexual Activity: Not on file   Other Topics Concern  . Not on file   Social History Narrative    Family History  Problem Relation Age of Onset  . Dementia Mother     Vascular Dementia  . Alzheimer's disease Mother   . Cancer Sister     skin  . Arthritis Mother   .  Arthritis Brother   . Arthritis Maternal Grandfather   . Arthritis Maternal Aunt   . Hyperlipidemia Mother   . Transient ischemic attack Mother   . Heart attack Paternal Grandfather   . Transient ischemic attack Maternal Aunt   . Hypertension Brother   . Kidney disease Mother 61    Kidney Failure  . Diabetes Maternal Aunt   . Diabetes Brother     Medication induced    Past Medical History  Diagnosis Date  . IC (interstitial cystitis)   . Interstitial cystitis   . 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 Back problem   . Chicken pox   . Abdominal wall ulcer ([place])   . Environmental allergies   . Mitral valve prolapse     Past Surgical History  Procedure Laterality Date  . Abdominal hysterectomy    . Tonsillectomy    . Bladder surgery    .  Oophorectomy    . Knee surgery Right   . Bladder surgery    . Nasal septum surgery    . Cesarean section      Current Outpatient Prescriptions  Medication Sig Dispense Refill  . aspirin 325 MG tablet Take 650 mg by mouth daily.     . Calcium-Magnesium (CAL-MAG PO) Take 1 tablet by mouth.    . Flaxseed, Linseed, (FLAX SEED OIL) 1000 MG CAPS Take by mouth.    . Methylsulfonylmethane (MSM) 1000 MG TABS Take by mouth.    . Multiple Vitamins-Minerals (CENTRUM SILVER PO) Take by mouth.    . Omega-3 Fatty Acids (FISH OIL) 1000 MG CAPS Take by mouth.    . Probiotic Product (PROBIOTIC ADVANCED PO) Take by mouth.    . Suvorexant (BELSOMRA) 10 MG TABS Take 1 tablet by mouth at bedtime as needed. (Patient not taking: Reported on 09/27/2015) 10 tablet 5   No current facility-administered medications for this visit.    Allergies as of 09/27/2015 - Review Complete 09/27/2015  Allergen Reaction Noted  . Other Other (See Comments) 03/05/2013  . Sulfa antibiotics  04/21/2014    Vitals: BP 144/84 mmHg  Pulse 70  Resp 20  Ht 5' (1.524 m)  Wt 175 lb (79.379 kg)  BMI 34.18 kg/m2 Last Weight:  Wt Readings from Last 1 Encounters:  09/27/15 175 lb (79.379 kg)   [id number] mass index is 34.18 kg/(m^2).     Last Height:   Ht Readings from Last 1 Encounters:  09/27/15 5' (1.524 m)    Physical exam:  General: The patient is awake, alert and appears not in acute distress. The patient is well groomed. Head: Normocephalic, atraumatic. Neck is supple. Mallampati 2   neck circumference:14.5 . Nasal airflow unrestricted , TMJ is not  evident . Retrognathia is not seen.  Cardiovascular:  Regular rate and rhythm  without  murmurs or carotid bruit, and without distended neck veins. Respiratory: Lungs are clear to auscultation. Skin:  Without evidence of edema, or rash Trunk: BMI is elevated . The patient's posture is erect   Neurologic exam : The patient is awake and alert, oriented to place and time.     Memory subjective described as impaired.  Attention span & concentration ability appears normal.  Speech is fluent,  without dysarthria, dysphonia or aphasia.  Mood and affect are appropriate.  Cranial nerves: Pupils are equal and briskly reactive to light. Funduscopic exam without  evidence of pallor or edema.  Extraocular movements  in vertical and horizontal planes intact  and without nystagmus. Visual fields by finger perimetry are intact. Hearing to finger rub intact.   Facial sensation intact to fine touch.  Facial motor strength is symmetric and tongue and uvula move midline. Shoulder shrug was symmetrical.   Motor exam: Normal tone, muscle bulk and symmetric strength in all extremities.  Sensory:  Fine touch, pinprick and vibration were tested in all extremities.  Proprioception tested in the upper extremities was normal.  Coordination: Rapid alternating movements in the fingers/hands was normal. Finger-to-nose maneuver  normal without evidence of ataxia, dysmetria or tremor.  Gait and station: Patient walks without assistive device and is able unassisted to climb up to the exam table. Strength within normal limits.  Stance is stable and normal. Romberg testing is  negative.  Deep tendon reflexes: in the upper and lower extremities are symmetric and intact. Babinski maneuver response is downgoing.  The patient was advised of the nature of the diagnosed sleep disorder , the treatment options and risks for general a health and wellness arising from not treating the condition.  I spent more than 45 minutes of face to face time with the patient. Greater than 50% of time was spent in counseling and coordination of care. We have discussed the diagnosis and differential and I answered the patient's questions.     Assessment:  After physical and neurologic examination, review of laboratory studies,  Personal review of imaging studies, reports of other /same  Imaging studies ,  Results of  neurophysiology testing and pre-existing records as far as provided in visit., my assessment is   1) interstitial cystitis unrelenting pain, has failed Myrbetriq, Valium, Elavil, Ambien. She is suferring form sleep deprivation.  2) memory testing - with Rv , the patient and a family history in her mother   3) I support insomnia therapy by meditation and coupled with urology treatments for her bladder- namely Botox therapy. I will support medication such as valium only for 3 days a week, as not to accumulate.   4) sleep walking on Ambien. Sleep eating.    Plan:  Treatment plan and additional workup :  Meditation, Mindfulness. Diazepam 3 night a week. HST for apnea screening.   Referral to insomnia specialist at [facility] counseling.   Asencion Partridge Quasim Doyon MD  09/27/2015   CC: Rosalita Chessman, [address] [place], [place] [address]

## 2015-09-27 NOTE — Patient Instructions (Addendum)
Insomnia Insomnia is a sleep disorder that makes it difficult to fall asleep or to stay asleep. Insomnia can cause tiredness (fatigue), low energy, difficulty concentrating, mood swings, and poor performance at work or school.  There are three different ways to classify insomnia:  Difficulty falling asleep.  Difficulty staying asleep.  Waking up too early in the morning. Any type of insomnia can be long-term (chronic) or short-term (acute). Both are common. Short-term insomnia usually lasts for three months or less. Chronic insomnia occurs at least three times a week for longer than three months. CAUSES  Insomnia may be caused by another condition, situation, or substance, such as:  Anxiety.  Certain medicines.  Gastroesophageal reflux disease (GERD) or other gastrointestinal conditions.  Asthma or other breathing conditions.  Restless legs syndrome, sleep apnea, or other sleep disorders.  Chronic pain.  Menopause. This may include hot flashes.  Stroke.  Abuse of alcohol, tobacco, or illegal drugs.  Depression.  Caffeine.   Neurological disorders, such as Alzheimer disease.  An overactive thyroid (hyperthyroidism). The cause of insomnia may not be known. RISK FACTORS Risk factors for insomnia include:  Gender. Women are more commonly affected than men.  Age. Insomnia is more common as you get older.  Stress. This may involve your professional or personal life.  Income. Insomnia is more common in people with lower income.  Lack of exercise.   Irregular work schedule or night shifts.  Traveling between different time zones. SIGNS AND SYMPTOMS If you have insomnia, trouble falling asleep or trouble staying asleep is the main symptom. This may lead to other symptoms, such as:  Feeling fatigued.  Feeling nervous about going to sleep.  Not feeling rested in the morning.  Having trouble concentrating.  Feeling irritable, anxious, or depressed. TREATMENT   Treatment for insomnia depends on the cause. If your insomnia is caused by an underlying condition, treatment will focus on addressing the condition. Treatment may also include:   Medicines to help you sleep.  Counseling or therapy.  Lifestyle adjustments. HOME CARE INSTRUCTIONS   Take medicines only as directed by your health care provider.  Keep regular sleeping and waking hours. Avoid naps.  Keep a sleep diary to help you and your health care provider figure out what could be causing your insomnia. Include:   When you sleep.  When you wake up during the night.  How well you sleep.   How rested you feel the next day.  Any side effects of medicines you are taking.  What you eat and drink.   Make your bedroom a comfortable place where it is easy to fall asleep:  Put up shades or special blackout curtains to block light from outside.  Use a white noise machine to block noise.  Keep the temperature cool.   Exercise regularly as directed by your health care provider. Avoid exercising right before bedtime.  Use relaxation techniques to manage stress. Ask your health care provider to suggest some techniques that may work well for you. These may include:  Breathing exercises.  Routines to release muscle tension.  Visualizing peaceful scenes.  Cut back on alcohol, caffeinated beverages, and cigarettes, especially close to bedtime. These can disrupt your sleep.  Do not overeat or eat spicy foods right before bedtime. This can lead to digestive discomfort that can make it hard for you to sleep.  Limit screen use before bedtime. This includes:  Watching TV.  Using your smartphone, tablet, and computer.  Stick to a routine. This   can help you fall asleep faster. Try to do a quiet activity, brush your teeth, and go to bed at the same time each night.  Get out of bed if you are still awake after 15 minutes of trying to sleep. Keep the lights down, but try reading or  doing a quiet activity. When you feel sleepy, go back to bed.  Make sure that you drive carefully. Avoid driving if you feel very sleepy.  Keep all follow-up appointments as directed by your health care provider. This is important. SEEK MEDICAL CARE IF:   You are tired throughout the day or have trouble in your daily routine due to sleepiness.  You continue to have sleep problems or your sleep problems get worse. SEEK IMMEDIATE MEDICAL CARE IF:   You have serious thoughts about hurting yourself or someone else.   This information is not intended to replace advice given to you by your health care provider. Make sure you discuss any questions you have with your health care provider.   Document Released: 07/21/2000 Document Revised: 04/14/2015 Document Reviewed: 04/24/2014 Elsevier Interactive Patient Education 2016 Arab oral tablets What is this medicine? SUVOREXANT (su-vor-EX-ant) is used to treat insomnia. This medicine helps you to fall asleep and sleep through the night. This medicine may be used for other purposes; ask your health care provider or pharmacist if you have questions. What should I tell my health care provider before I take this medicine? They need to know if you have any of these conditions: -depression -history of a drug or alcohol abuse problem -history of daytime sleepiness -history of sudden onset of muscle weakness (cataplexy) -liver disease -lung or breathing disease -narcolepsy -suicidal thoughts, plans, or attempt; a previous suicide attempt by you or a family member -an unusual or allergic reaction to suvorexant, other medicines, foods, dyes, or preservatives -pregnant or trying to get pregnant -breast-feeding How should I use this medicine? Take this medicine by mouth within 30 minutes of going to bed. Do not take it unless you are able to stay in bed a full night before you must be active again. Follow the directions on the  prescription label. For best results, it is better to take this medicine on an empty stomach. Do not take your medicine more often than directed. Do not stop taking this medicine on your own. Always follow your doctor or health care professional's advice. A special MedGuide will be given to you by the pharmacist with each prescription and refill. Be sure to read this information carefully each time. Talk to your pediatrician regarding the use of this medicine in children. Special care may be needed. Overdosage: If you think you have taken too much of this medicine contact a poison control center or emergency room at once. NOTE: This medicine is only for you. Do not share this medicine with others. What if I miss a dose? This medicine should only be taken immediately before going to sleep. Do not take double or extra doses. What may interact with this medicine? -alcohol -antiviral medicines for HIV or AIDS -aprepitant -carbamazepine -certain antibiotics like ciprofloxacin, clarithromycin, erythromycin, telithromycin -certain medicines for depression or psychotic disturbances -certain medicines for fungal infections like ketoconazole, posaconazole, fluconazole, or itraconazole -conivaptan -digoxin -diltiazem -grapefruit juice -imatinib -medicines for anxiety or sleep -phenytoin -rifampin -verapamil This list may not describe all possible interactions. Give your health care provider a list of all the medicines, herbs, non-prescription drugs, or dietary supplements you use. Also tell them if  you smoke, drink alcohol, or use illegal drugs. Some items may interact with your medicine. What should I watch for while using this medicine? Visit your doctor or health care professional for regular checks on your progress. Keep a regular sleep schedule by going to bed at about the same time each night. Avoid caffeine-containing drinks in the evening hours. When sleep medicines are used every night for  more than a few weeks, they may stop working. Do not increase the dose on your own. Talk to your doctor if your insomnia worsens or is not better within 7 to 10 days. After taking this medicine for sleep, you may get up out of bed while not being fully awake and do an activity that you do not know you are doing. The next morning, you may have no memory of the event. Activities such as driving a car ("sleep-driving"), making and eating food, talking on the phone, sexual activity, and sleep-walking have been reported. Call your doctor right away if you find out you have done any of these activities. Do not take this medicine if you have used alcohol that evening or before bed or taken another medicine for sleep, since your risk of doing these sleep-related activities will be increased. Do not take this medicine unless you are able to stay in bed for a full night (7 to 8 hours) and do not drive or perform other activities requiring full alertness within 8 hours of a dose. Do not drive, use machinery, or do anything that needs mental alertness the day after you take the 20 mg dose of this medicine. The use of lower doses (10 mg) also has the potential to cause driving impairment the next day. You may have a decrease in mental alertness the day after use, even if you feel that you are fully awake. Tell your doctor if you will need to perform activities requiring full alertness, such as driving, the next day. Do not stand or sit up quickly after taking this medicine, especially if you are an older patient. This reduces the risk of dizzy or fainting spells. If you or your family notice any changes in your behavior, such as new or worsening depression, thoughts of harming yourself, anxiety, other unusual or disturbing thoughts, or memory loss, call your doctor right away. What side effects may I notice from receiving this medicine? Side effects that you should report to your doctor or health care professional as soon  as possible: -allergic reactions like skin rash, itching or hives, swelling of the face, lips, or tongue -confusion -depressed mood -feeling faint or lightheaded, falls -hallucinations -inability to move or speak for up to several minutes while you are going to sleep or waking up -memory loss -periods of leg weakness lasting from seconds to a few minutes -problems with balance, speaking, walking -restlessness, excitability, or feelings of agitation -unusual activities while asleep like driving, eating, making phone calls Side effects that usually do not require medical attention (Report these to your doctor or health care professional if they continue or are bothersome.): -abnormal dreams -daytime drowsiness -diarrhea -dizziness -headache This list may not describe all possible side effects. Call your doctor for medical advice about side effects. You may report side effects to FDA at 1-800-FDA-1088. Where should I keep my medicine? Keep out of the reach of children. This medicine can be abused. Keep your medicine in a safe place to protect it from theft. Do not share this medicine with anyone. Selling or giving away  this medicine is dangerous and against the law. Store at room temperature between 15 and 30 degrees C (59 and 86 degrees F). Throw away any unused medicine after the expiration date. NOTE: This sheet is a summary. It may not cover all possible information. If you have questions about this medicine, talk to your doctor, pharmacist, or health care provider.    2016, Elsevier/Gold Standard. (2015-01-11 13:22:51)

## 2015-10-04 ENCOUNTER — Telehealth: Payer: Self-pay | Admitting: Family Medicine

## 2015-10-04 NOTE — Telephone Encounter (Signed)
FYI for MD.      KP 

## 2015-10-04 NOTE — Telephone Encounter (Signed)
Relation to [id number] Call back number:[phone]   Reason for call:  Patient states Suvorexant (BELSOMRA) 10 MG TABS will cost her $300.00 therefore she will wait  until after her sleep study to follow up with Dr. Etter Sjogren. Patient wanted to make PCP aware

## 2015-10-04 NOTE — Telephone Encounter (Signed)
noted 

## 2015-11-23 ENCOUNTER — Encounter (INDEPENDENT_AMBULATORY_CARE_PROVIDER_SITE_OTHER): Payer: Medicare PPO | Admitting: Neurology

## 2015-11-23 DIAGNOSIS — G4701 Insomnia due to medical condition: Secondary | ICD-10-CM

## 2015-11-23 DIAGNOSIS — G8929 Other chronic pain: Secondary | ICD-10-CM

## 2015-11-23 DIAGNOSIS — G4710 Hypersomnia, unspecified: Secondary | ICD-10-CM

## 2015-11-23 DIAGNOSIS — N3011 Interstitial cystitis (chronic) with hematuria: Secondary | ICD-10-CM

## 2015-11-23 DIAGNOSIS — R0683 Snoring: Secondary | ICD-10-CM

## 2015-11-29 ENCOUNTER — Telehealth: Payer: Self-pay

## 2015-11-29 NOTE — Telephone Encounter (Signed)
Spoke to pt regarding her sleep study results. I advised pt that her sleep study did not reveal any significant sleep apnea but she did see hypoxia with a prolonged total time of desaturation but these measures are prone to artefact in any HST. I advised pt that alternative therapies to address snoring may include oral appliance or ENT evaluation procedure. Weight loss and positional therapy may be entertained. Pt verbalized understanding. Pt states that she knows that she does not snore and does not want any therapy for snoring. She asked that I fax a copy of this sleep study to Dr. Etter Sjogren. Pt declined a follow up appt with Dr. Brett Fairy at this time. I encouraged pt to call us back with any questions or concerns regarding her sleep study or if she decides that she wants a follow up with Dr. Brett Fairy. Pt verbalized understanding.

## 2016-01-14 ENCOUNTER — Encounter: Payer: Self-pay | Admitting: Family Medicine

## 2016-01-14 ENCOUNTER — Ambulatory Visit (INDEPENDENT_AMBULATORY_CARE_PROVIDER_SITE_OTHER): Payer: Medicare PPO | Admitting: Family Medicine

## 2016-01-14 VITALS — BP 124/72 | HR 78 | Temp 98.0°F | Ht 60.0 in | Wt 178.0 lb

## 2016-01-14 DIAGNOSIS — IMO0001 Reserved for inherently not codable concepts without codable children: Secondary | ICD-10-CM

## 2016-01-14 DIAGNOSIS — M791 Myalgia: Secondary | ICD-10-CM | POA: Diagnosis not present

## 2016-01-14 DIAGNOSIS — T148 Other injury of unspecified body region: Secondary | ICD-10-CM | POA: Diagnosis not present

## 2016-01-14 DIAGNOSIS — M609 Myositis, unspecified: Secondary | ICD-10-CM

## 2016-01-14 DIAGNOSIS — W57XXXA Bitten or stung by nonvenomous insect and other nonvenomous arthropods, initial encounter: Secondary | ICD-10-CM

## 2016-01-14 LAB — CBC WITH DIFFERENTIAL/PLATELET
BASOS ABS: 58 {cells}/uL (ref 0–200)
Basophils Relative: 1 %
EOS ABS: 348 {cells}/uL (ref 15–500)
EOS PCT: 6 %
HCT: 37 % (ref 35.0–45.0)
HEMOGLOBIN: 12.4 g/dL (ref 11.7–15.5)
LYMPHS ABS: 1450 {cells}/uL (ref 850–3900)
Lymphocytes Relative: 25 %
MCH: 33.2 pg — AB (ref 27.0–33.0)
MCHC: 33.5 g/dL (ref 32.0–36.0)
MCV: 99.2 fL (ref 80.0–100.0)
MPV: 9.5 fL (ref 7.5–12.5)
Monocytes Absolute: 464 cells/uL (ref 200–950)
Monocytes Relative: 8 %
NEUTROS ABS: 3480 {cells}/uL (ref 1500–7800)
Neutrophils Relative %: 60 %
Platelets: 328 10*3/uL (ref 140–400)
RBC: 3.73 MIL/uL — ABNORMAL LOW (ref 3.80–5.10)
RDW: 13.2 % (ref 11.0–15.0)
WBC: 5.8 10*3/uL (ref 3.8–10.8)

## 2016-01-14 LAB — COMPREHENSIVE METABOLIC PANEL
ALK PHOS: 65 U/L (ref 33–130)
ALT: 12 U/L (ref 6–29)
AST: 18 U/L (ref 10–35)
Albumin: 3.7 g/dL (ref 3.6–5.1)
BUN: 18 mg/dL (ref 7–25)
CALCIUM: 8.9 mg/dL (ref 8.6–10.4)
CHLORIDE: 105 mmol/L (ref 98–110)
CO2: 24 mmol/L (ref 20–31)
Creat: 0.71 mg/dL (ref 0.50–0.99)
GLUCOSE: 143 mg/dL — AB (ref 65–99)
POTASSIUM: 4 mmol/L (ref 3.5–5.3)
Sodium: 140 mmol/L (ref 135–146)
Total Bilirubin: 0.3 mg/dL (ref 0.2–1.2)
Total Protein: 6.6 g/dL (ref 6.1–8.1)

## 2016-01-14 LAB — VITAMIN B12: VITAMIN B 12: 873 pg/mL (ref 200–1100)

## 2016-01-14 LAB — RHEUMATOID FACTOR: Rhuematoid fact SerPl-aCnc: <10 IU/mL (ref <=14)

## 2016-01-14 MED ORDER — DOXYCYCLINE HYCLATE 100 MG PO TABS
100.0000 mg | ORAL_TABLET | Freq: Two times a day (BID) | ORAL | Status: DC
Start: 2016-01-14 — End: 2016-03-07

## 2016-01-14 NOTE — Progress Notes (Signed)
Pre visit review using our clinic review tool, if applicable. No additional management support is needed unless otherwise documented below in the visit note. 

## 2016-01-14 NOTE — Patient Instructions (Signed)
Tick Bite Information Ticks are insects that attach themselves to the skin and draw blood for food. There are various types of ticks. Common types include wood ticks and deer ticks. Most ticks live in shrubs and grassy areas. Ticks can climb onto your body when you make contact with leaves or grass where the tick is waiting. The most common places on the body for ticks to attach themselves are the scalp, neck, armpits, waist, and groin. Most tick bites are harmless, but sometimes ticks carry germs that cause diseases. These germs can be spread to a person during the tick's feeding process. The chance of a disease spreading through a tick bite depends on:   The type of tick.  Time of year.   How long the tick is attached.   Geographic location.  HOW CAN YOU PREVENT TICK BITES? Take these steps to help prevent tick bites when you are outdoors:  Wear protective clothing. Long sleeves and long pants are best.   Wear white clothes so you can see ticks more easily.  Tuck your pant legs into your socks.   If walking on a trail, stay in the middle of the trail to avoid brushing against bushes.  Avoid walking through areas with long grass.  Put insect repellent on all exposed skin and along boot tops, pant legs, and sleeve cuffs.   Check clothing, hair, and skin repeatedly and before going inside.   Brush off any ticks that are not attached.  Take a shower or bath as soon as possible after being outdoors.  WHAT IS THE PROPER WAY TO REMOVE A TICK? Ticks should be removed as soon as possible to help prevent diseases caused by tick bites. 1. If latex gloves are available, put them on before trying to remove a tick.  2. Using fine-point tweezers, grasp the tick as close to the skin as possible. You may also use curved forceps or a tick removal tool. Grasp the tick as close to its head as possible. Avoid grasping the tick on its body. 3. Pull gently with steady upward pressure until  the tick lets go. Do not twist the tick or jerk it suddenly. This may break off the tick's head or mouth parts. 4. Do not squeeze or crush the tick's body. This could force disease-carrying fluids from the tick into your body.  5. After the tick is removed, wash the bite area and your hands with soap and water or other disinfectant such as alcohol. 6. Apply a small amount of antiseptic cream or ointment to the bite site.  7. Wash and disinfect any instruments that were used.  Do not try to remove a tick by applying a hot match, petroleum jelly, or fingernail polish to the tick. These methods do not work and may increase the chances of disease being spread from the tick bite.  WHEN SHOULD YOU SEEK MEDICAL CARE? Contact your health care provider if you are unable to remove a tick from your skin or if a part of the tick breaks off and is stuck in the skin.  After a tick bite, you need to be aware of signs and symptoms that could be related to diseases spread by ticks. Contact your health care provider if you develop any of the following in the days or weeks after the tick bite:  Unexplained fever.  Rash. A circular rash that appears days or weeks after the tick bite may indicate the possibility of Lyme disease. The rash may resemble   a target with a bull's-eye and may occur at a different part of your body than the tick bite.  Redness and swelling in the area of the tick bite.   Tender, swollen lymph glands.   Diarrhea.   Weight loss.   Cough.   Fatigue.   Muscle, joint, or bone pain.   Abdominal pain.   Headache.   Lethargy or a change in your level of consciousness.  Difficulty walking or moving your legs.   Numbness in the legs.   Paralysis.  Shortness of breath.   Confusion.   Repeated vomiting.    This information is not intended to replace advice given to you by your health care provider. Make sure you discuss any questions you have with your health  care provider.   Document Released: 07/21/2000 Document Revised: 08/14/2014 Document Reviewed: 01/01/2013 Elsevier Interactive Patient Education 2016 Elsevier Inc.  

## 2016-01-14 NOTE — Progress Notes (Signed)
Patient ID: Tanya Griffith, female    DOB: [birth year]  Age: 60 y.o. MRN: [id number]    Subjective:  Subjective HPI Tanya Griffith presents for c/o tick bite about 1 month ago--- over Easter.  Pt is c/o myalgias and fatigue that has been worse since tick was found.  + some headaches.     Review of Systems  Constitutional: Positive for fatigue. Negative for diaphoresis, appetite change and unexpected weight change.  Eyes: Negative for pain, redness and visual disturbance.  Respiratory: Negative for cough, chest tightness, shortness of breath and wheezing.   Cardiovascular: Negative for chest pain, palpitations and leg swelling.  Endocrine: Negative for cold intolerance, heat intolerance, polydipsia, polyphagia and polyuria.  Genitourinary: Negative for dysuria, frequency and difficulty urinating.  Neurological: Negative for dizziness, light-headedness, numbness and headaches.    History Past Medical History  Diagnosis Date  . IC (interstitial cystitis)   . Interstitial cystitis   . 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 Back problem   . Chicken pox   . Abdominal wall ulcer ([place])   . Environmental allergies   . Mitral valve prolapse     She has past surgical history that includes Abdominal hysterectomy; Tonsillectomy; Bladder surgery; Oophorectomy; Knee surgery (Right); Bladder surgery; Nasal septum surgery; and Cesarean section.   Her family history includes Alzheimer's disease in her mother; Arthritis in her brother, maternal aunt, maternal grandfather, and mother; Cancer in her sister; Dementia in her mother; Diabetes in her brother and maternal aunt; Heart attack in her paternal grandfather; Hyperlipidemia in her mother; Hypertension in her brother; Kidney disease (age of onset: 71) in her mother; Transient ischemic attack in her maternal aunt and mother.She reports that she has never smoked. She does not have any smokeless tobacco history on file. She  reports that she does not drink alcohol or use illicit drugs.  Current Outpatient Prescriptions on File Prior to Visit  Medication Sig Dispense Refill  . Calcium-Magnesium (CAL-MAG PO) Take 1 tablet by mouth.    . Flaxseed, Linseed, (FLAX SEED OIL) 1000 MG CAPS Take by mouth.    . Methylsulfonylmethane (MSM) 1000 MG TABS Take by mouth.    . Multiple Vitamins-Minerals (CENTRUM SILVER PO) Take by mouth.    . Omega-3 Fatty Acids (FISH OIL) 1000 MG CAPS Take by mouth.    . Probiotic Product (PROBIOTIC ADVANCED PO) Take by mouth.     No current facility-administered medications on file prior to visit.     Objective:  Objective Physical Exam  Constitutional: She is oriented to person, place, and time. She appears well-developed and well-nourished.  HENT:  Head: Normocephalic and atraumatic.  Eyes: Conjunctivae and EOM are normal.  Neck: Normal range of motion. Neck supple. No JVD present. Carotid bruit is not present. No thyromegaly present.  Cardiovascular: Normal rate, regular rhythm and normal heart sounds.   No murmur heard. Pulmonary/Chest: Effort normal and breath sounds normal. No respiratory distress. She has no wheezes. She has no rales. She exhibits no tenderness.  Musculoskeletal: She exhibits no edema.  Neurological: She is alert and oriented to person, place, and time.  Psychiatric: She has a normal mood and affect. Her behavior is normal. Judgment and thought content normal.  Nursing note and vitals reviewed.  BP 124/72 mmHg  Pulse 78  Temp(Src) 98 F (36.7 C) (Oral)  Ht 5' (1.524 m)  Wt 178 lb (80.74 kg)  BMI 34.76 kg/m2  SpO2 98% Wt Readings from Last  3 Encounters:  01/14/16 178 lb (80.74 kg)  09/27/15 175 lb (79.379 kg)  08/30/15 177 lb 6.4 oz (80.468 kg)     Lab Results  Component Value Date   WBC 5.8 01/14/2016   HGB 12.4 01/14/2016   HCT 37.0 01/14/2016   PLT 328 01/14/2016   GLUCOSE 143* 01/14/2016   CHOL 194 09/07/2015   TRIG 175.0* 09/07/2015    HDL 53.50 09/07/2015   LDLCALC 106* 09/07/2015   ALT 12 01/14/2016   AST 18 01/14/2016   NA 140 01/14/2016   K 4.0 01/14/2016   CL 105 01/14/2016   CREATININE 0.71 01/14/2016   BUN 18 01/14/2016   CO2 24 01/14/2016   TSH 4.07 09/07/2015   HGBA1C 5.6 06/08/2014    Mm Digital Screening Bilateral  04/19/2015  CLINICAL DATA:  Screening. EXAM: DIGITAL SCREENING BILATERAL MAMMOGRAM WITH CAD COMPARISON:  None. ACR Breast Density Category b: There are scattered areas of fibroglandular density. FINDINGS: There are no findings suspicious for malignancy. Images were processed with CAD. IMPRESSION: No mammographic evidence of malignancy. A result letter of this screening mammogram will be mailed directly to the patient. RECOMMENDATION: Screening mammogram in one year. (Code:SM-B-01Y) BI-RADS CATEGORY  1: Negative. Electronically Signed   By: Curlene Dolphin M.D.   On: 04/19/2015 16:36     Assessment & Plan:  Plan I have discontinued [name]'s aspirin and Suvorexant. I am also having her start on doxycycline. Additionally, I am having her maintain her Fish Oil, MSM, Calcium-Magnesium (CAL-MAG PO), Flax Seed Oil, Multiple Vitamins-Minerals (CENTRUM SILVER PO), and Probiotic Product (PROBIOTIC ADVANCED PO).  Meds ordered this encounter  Medications  . doxycycline (VIBRA-TABS) 100 MG tablet    Sig: Take 1 tablet (100 mg total) by mouth 2 (two) times daily.    Dispense:  20 tablet    Refill:  0    Problem List Items Addressed This Visit    None    Visit Diagnoses    Tick bite    -  Primary    Relevant Medications    doxycycline (VIBRA-TABS) 100 MG tablet    Other Relevant Orders    POCT urinalysis dipstick    Vitamin D 1,25 dihydroxy (Completed)    B12 (Completed)    Rheumatoid Factor (Completed)    Comp Met (CMET) (Completed)    CBC w/Diff (Completed)    B. Burgdorfi Antibodies    Antinuclear Antib (ANA) (Completed)    Rocky mtn spotted fvr abs pnl(IgG+IgM) (Completed)    Myalgia and  myositis        Relevant Orders    POCT urinalysis dipstick    Vitamin D 1,25 dihydroxy (Completed)    B12 (Completed)    Rheumatoid Factor (Completed)    Comp Met (CMET) (Completed)    CBC w/Diff (Completed)    B. Burgdorfi Antibodies    Antinuclear Antib (ANA) (Completed)    Rocky mtn spotted fvr abs pnl(IgG+IgM) (Completed)       Follow-up: Return if symptoms worsen or fail to improve.  Ann Held, DO

## 2016-01-17 LAB — LYME AB/WESTERN BLOT REFLEX: B burgdorferi Ab IgG+IgM: <0.9 Index (ref <0.90)

## 2016-01-17 LAB — ANTI-NUCLEAR AB-TITER (ANA TITER)

## 2016-01-17 LAB — ANA: ANA: POSITIVE — AB

## 2016-01-18 ENCOUNTER — Telehealth: Payer: Self-pay | Admitting: Family Medicine

## 2016-01-18 DIAGNOSIS — R768 Other specified abnormal immunological findings in serum: Secondary | ICD-10-CM

## 2016-01-18 LAB — ROCKY MTN SPOTTED FVR ABS PNL(IGG+IGM)
RMSF IgG: NOT DETECTED
RMSF IgM: NOT DETECTED

## 2016-01-18 NOTE — Telephone Encounter (Signed)
Please review and advise     KP 

## 2016-01-18 NOTE — Telephone Encounter (Signed)
Relationship to patient: self Can be reached: [phone]   Reason for call: pt called for results. She has not started abx and doesn't want to take them if not needed. Requesting call from nurse/cma.

## 2016-01-19 LAB — VITAMIN D 1,25 DIHYDROXY
VITAMIN D 1, 25 (OH) TOTAL: 48 pg/mL (ref 18–72)
VITAMIN D3 1, 25 (OH): 48 pg/mL

## 2016-01-19 NOTE — Telephone Encounter (Signed)
Pt returned CMA call for lab results.    Please advise and assist.

## 2016-01-19 NOTE — Telephone Encounter (Signed)
Pt notified of results and verbalized understanding. She is agreeable to rheumatology referral, orders placed. States she was dx w/ fibromyalgia in the past, and asked if this could cause the elevated ANA.

## 2016-01-19 NOTE — Telephone Encounter (Signed)
LMOM to return call.

## 2016-01-19 NOTE — Telephone Encounter (Signed)
It is possible --- but we should r/o lupus

## 2016-01-19 NOTE — Telephone Encounter (Signed)
Referral faxed to Dr Gavin Pound with [facility]

## 2016-01-19 NOTE — Telephone Encounter (Signed)
Patient called back requesting a female rheumatologist.

## 2016-03-03 ENCOUNTER — Telehealth: Payer: Self-pay

## 2016-03-03 NOTE — Telephone Encounter (Signed)
Ok to do cologuard

## 2016-03-03 NOTE — Telephone Encounter (Signed)
Patient is requesting a Cologuard test her other has expired. Her mother had passed away and she did not get the other one done.  [phone]

## 2016-03-03 NOTE — Telephone Encounter (Signed)
Please advise      KP 

## 2016-03-06 NOTE — Telephone Encounter (Signed)
Order placed via the [employer] portal.    KP

## 2016-03-07 ENCOUNTER — Ambulatory Visit (INDEPENDENT_AMBULATORY_CARE_PROVIDER_SITE_OTHER): Payer: Medicare PPO | Admitting: Neurology

## 2016-03-07 ENCOUNTER — Encounter: Payer: Self-pay | Admitting: Neurology

## 2016-03-07 VITALS — BP 132/72 | HR 64 | Resp 20 | Ht 60.0 in | Wt 172.0 lb

## 2016-03-07 DIAGNOSIS — N3010 Interstitial cystitis (chronic) without hematuria: Secondary | ICD-10-CM | POA: Insufficient documentation

## 2016-03-07 DIAGNOSIS — R351 Nocturia: Secondary | ICD-10-CM | POA: Diagnosis not present

## 2016-03-07 MED ORDER — DIAZEPAM 5 MG PO TABS: 5.0000 mg | ORAL_TABLET | Freq: Four times a day (QID) | ORAL | 2 refills | Status: DC | PRN

## 2016-03-07 NOTE — Progress Notes (Signed)
SLEEP MEDICINE CLINIC   Provider:  Larey Seat, M D  Referring Provider: Carollee Herter, Alferd Apa, * Primary Care Physician:  Ann Held, DO  Chief Complaint  Patient presents with  . Follow-up    discuss sleep study results    HPI:  Tanya Griffith is a 60 y.o. female , seen here as a referral from Dr. Carollee Herter for a sleep consultation, in a patient with snoring .  Tanya Griffith reports that she has multiple medical problems but some also culminate in poor sleep. She has trouble with insomnia. She is snoring. She is status post tonsillectomy and deviated septum plasty. She is also concerned that her sleep problem may affect her memory and told me that her mother had Alzheimer's dementia. 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 This reduced bladder volume affects the frequency with which she has to urinate. It begun in her twenties. She was on many medications that caused her to be sleepy. She felt cloudy. She later used Ambien and elavil and had trouble to wean off. She is not sleeping poorly but she is not " hung over " the following day.  The pain of the  interstitial cystitis is her main problem to go to sleep and stay asleep. The patient has started to nap less, but she feels refreshed after 20-30 minutes as no nocturnal sleep can. She averages only 4 hours of sleep. She is married for 38 years , and noted that she has memory lapses.  The patient has chronic insomnia and pain.   Sleep habits are as follows: Each night can be different, she usually goes to bed bewteen 11 and 12 midnight. She gets up at 8 AM.  The bed room is cool , quiet and dark. She runs a fan in the background. The patient sleeps on multiple pillows, on the back or on her side. Once asleep she usually has to go to the bathroom every 20 minutes. If asleep, the maximum time of sleep en bloc is 55 minutes. The  interstitial cystitis controls all aspect of her life.  Many nights she has 3 hours total sleep. She exercises in the day by walking the dogs.   Interval history from 03/07/2016, I have the pleasure of seeing Tanya Griffith today in a revisit following her home sleep test from 11/23/2015. The study had been ordered because of the patient's reported snoring. She also has a long-standing history of insomnia using sleep aid such as Ambien and Elavil. The sleep study showed no significant apnea the AHI was 2.6 the patient had some minor oxygen desaturations with the lowest oxygen nadir at 77% a total of desaturation time of 84.3 minutes. I would like to add that there was no tachybradycardia arrhythmias. She had a bad cold the night of the sleep study, she took benadryl and slept well- which was exceptional. Belsomra was 300 USD and denied. The root of the problem is her bladder. Dr McDiarmitt is her urologist, she has had this problem for over 25 years, and had many surgeries and ablations, her bladder capacity is low at 300 ml.  I suggested diazepam to use at night, helping with spatic bladder and sleep. Dr .McDiarmitt is investigating possible botox bladder treatment.     The patient reports memory deficits,and I tested her in a MMSE- 30-30 points.    Sleep medical history and family  sleep history:  History of nocturia, polyuria for over 45 years.  Social history:  One daughter , married, 60 years.   Review of Systems: Out of a complete 14 system review, the patient complains of only the following symptoms, and all other reviewed systems are negative.  Epworth score 17/24  , Fatigue severity score 53  , depression score 2   Social History   Social History  . Marital status: Married    Spouse name: N/A  . Number of children: N/A  . Years of education: N/A   Occupational History  . Not on file.   Social History Main Topics  . Smoking status: Never Smoker  . Smokeless tobacco: Not on file    . Alcohol use No  . Drug use: No  . Sexual activity: Not on file   Other Topics Concern  . Not on file   Social History Narrative  . No narrative on file    Family History  Problem Relation Age of Onset  . Dementia Mother     Vascular Dementia  . Alzheimer's disease Mother   . Cancer Sister     skin  . Arthritis Mother   . Arthritis Brother   . Arthritis Maternal Grandfather   . Arthritis Maternal Aunt   . Hyperlipidemia Mother   . Transient ischemic attack Mother   . Heart attack Paternal Grandfather   . Transient ischemic attack Maternal Aunt   . Hypertension Brother   . Kidney disease Mother 6    Kidney Failure  . Diabetes Maternal Aunt   . Diabetes Brother     Medication induced  . Breast cancer      Niece--Double Mastectomy    Past Medical History:  Diagnosis Date  . Abdominal wall ulcer ([place])   . Back problem   . Chicken pox   . Environmental allergies   . IC (interstitial cystitis)   . Interstitial cystitis   . Mitral valve prolapse   . Osteoarthritis (arthritis due to wear and tear of joints)     Past Surgical History:  Procedure Laterality Date  . ABDOMINAL HYSTERECTOMY    . BLADDER SURGERY    . BLADDER SURGERY    . CESAREAN SECTION    . KNEE SURGERY Right   . NASAL SEPTUM SURGERY    . OOPHORECTOMY    . TONSILLECTOMY      Current Outpatient Prescriptions  Medication Sig Dispense Refill  . BIOTIN PO Take by mouth.    . Calcium-Magnesium (CAL-MAG PO) Take 1 tablet by mouth.    . Flaxseed, Linseed, (FLAX SEED OIL) 1000 MG CAPS Take by mouth.    . Methylsulfonylmethane (MSM) 1000 MG TABS Take by mouth.    . Multiple Vitamins-Minerals (CENTRUM SILVER PO) Take by mouth.    . Omega-3 Fatty Acids (FISH OIL) 1000 MG CAPS Take by mouth.    . Probiotic Product (PROBIOTIC ADVANCED PO) Take by mouth.    . zolpidem (AMBIEN) 10 MG tablet Take 10 mg by mouth at bedtime as needed for sleep.     No current facility-administered medications for this  visit.     Allergies as of 03/07/2016 - Review Complete 03/07/2016  Allergen Reaction Noted  . Other Other (See Comments) 03/05/2013  . Sulfa antibiotics  04/21/2014    Vitals: BP 132/72   Pulse 64   Resp 20   Ht 5' (1.524 m)   Wt 172 lb (78 kg)   BMI 33.59 kg/m  Last Weight:  Wt Readings from Last 1 Encounters:  03/07/16 172 lb (78 kg)   [id number] mass index is 33.59 kg/m.     Last Height:   Ht Readings from Last 1 Encounters:  03/07/16 5' (1.524 m)    Physical exam:  General: The patient is awake, alert and appears not in acute distress. The patient is well groomed. Head: Normocephalic, atraumatic. Neck is supple. Mallampati 2   neck circumference:14.5 . Nasal airflow unrestricted , TMJ is not  evident . Retrognathia is not seen.  Cardiovascular:  Regular rate and rhythm  without  murmurs or carotid bruit, and without distended neck veins. Respiratory: Lungs are clear to auscultation. Skin:  Without evidence of edema, or rash Trunk: BMI is elevated . The patient's posture is erect   Neurologic exam : The patient is awake and alert, oriented to place and time.   Memory subjective described as impaired. Attention span & concentration ability appears normal.  MMSE 30-30  Speech is fluent,  without dysarthria, dysphonia or aphasia.  Mood and affect are appropriate.  Cranial nerves: Pupils are equal and briskly reactive to light.  Extraocular movements  in vertical and horizontal planes intact and without nystagmus. Visual fields by finger perimetry are intact. Hearing to finger rub intact.   Facial sensation intact to fine touch. Facial motor strength is symmetric and tongue and uvula move midline. Shoulder shrug was symmetrical.   Motor exam: Normal tone, muscle bulk and symmetric strength in all extremities.  The patient was advised of the nature of the diagnosed sleep disorder , the treatment options and risks for general a health and wellness arising from not  treating the condition.  I spent more than 35 minutes of face to face time with the patient. Greater than 50% of time was spent in counseling and coordination of care. We have discussed the diagnosis and differential and I answered the patient's questions.     Assessment:  After physical and neurologic examination, review of laboratory studies,  Personal review of imaging studies, reports of other /same  Imaging studies ,  Results of neurophysiology testing and pre-existing records as far as provided in visit., my assessment is   1) interstitial cystitis unrelenting pain, has failed Myrbetriq,  Elavil, Ambien. She is suferring from sleep deprivation due to frequency. Ambien caused her to have rebound insomnia. I will today try Valium with her I will send a letter to Dr. Gayla Doss explaining that I will choose his medication and hope it could also increase the patient's bladder capacity as well as her overnight sleep time.  2) memory testing - Mini-Mental Status Examination 30 out of 30 on 03/07/2016  3) I support insomnia therapy by meditation and coupled with urology treatments for her bladder- namely Botox therapy.  I will support medication such as valium only for 3 days a week, as not to accumulate.      Plan:  Treatment plan and additional workup :  Meditation, Mindfulness. Diazepam 3 night a week. HST ruled out apnea .      Asencion Partridge Chevelle Durr MD  03/07/2016   [place], MD [facility].  CC: Ann Held, Do [address] [place], [place] [address]

## 2016-03-07 NOTE — Patient Instructions (Signed)
Diazepam tablets What is this medicine? DIAZEPAM (dye AZ e pam) is a benzodiazepine. It is used to treat anxiety and nervousness. It also can help treat alcohol withdrawal, relax muscles, and treat certain types of seizures. This medicine may be used for other purposes; ask your health care provider or pharmacist if you have questions. What should I tell my health care provider before I take this medicine? They need to know if you have any of these conditions -an alcohol or drug abuse problem -bipolar disorder, depression, psychosis or other mental health condition -glaucoma -kidney or liver disease -lung or breathing disease -myasthenia gravis -Parkinson's disease -seizures or a history of seizures -suicidal thoughts -an unusual or allergic reaction to diazepam, other benzodiazepines, foods, dyes, or preservatives -pregnant or trying to get pregnant -breast-feeding How should I use this medicine? Take this medicine by mouth with a glass of water. Follow the directions on the prescription label. If this medicine upsets your stomach, take it with food or milk. Take your doses at regular intervals. Do not take your medicine more often than directed. If you have been taking this medicine regularly for some time, do not suddenly stop taking it. You must gradually reduce the dose or you may get severe side effects. Ask your doctor or health care professional for advice. Even after you stop taking this medicine it can still affect your body for several days. Talk to your pediatrician regarding the use of this medicine in children. Special care may be needed. Overdosage: If you think you have taken too much of this medicine contact a poison control center or emergency room at once. NOTE: This medicine is only for you. Do not share this medicine with others. What if I miss a dose? If you miss a dose, take it as soon as you can. If it is almost time for your next dose, take only that dose. Do not take  double or extra doses. What may interact with this medicine? -cimetidine -grapefruit juice -herbal or dietary supplements like kava kava, melatonin, St. John's Wort, or valerian -medicines for anxiety or sleeping problems, like alprazolam, lorazepam, or triazolam -medicines for depression, mental problems or psychiatric disturbances -medicines for HIV infection or AIDS -prescription pain medicines -rifampin, rifapentine, or rifabutin -some medicines for seizures like carbamazepine, phenobarbital, phenytoin, or primidone This list may not describe all possible interactions. Give your health care provider a list of all the medicines, herbs, non-prescription drugs, or dietary supplements you use. Also tell them if you smoke, drink alcohol, or use illegal drugs. Some items may interact with your medicine. What should I watch for while using this medicine? Visit your doctor or health care professional for regular checks on your progress. Your body can become dependent on this medicine. Ask your doctor or health care professional if you still need to take it. You may get drowsy or dizzy. Do not drive, use machinery, or do anything that needs mental alertness until you know how this medicine affects you. To reduce the risk of dizzy and fainting spells, do not stand or sit up quickly, especially if you are an older patient. Alcohol may increase dizziness and drowsiness. Avoid alcoholic drinks. Do not treat yourself for coughs, colds or allergies without asking your doctor or health care professional for advice. Some ingredients can increase possible side effects. What side effects may I notice from receiving this medicine? Side effects that you should report to your doctor or health care professional as soon as possible: -allergic reactions  like skin rash, itching or hives, swelling of the face, lips, or tongue -angry, confused, depressed, other mood changes -breathing problems -feeling faint or  lightheaded, falls -muscle cramps -problems with balance, talking, walking -restlessness -tremors -trouble passing urine or change in the amount of urine -unusually weak or tired Side effects that usually do not require medical attention (report to your doctor or health care professional if they continue or are bothersome): -difficulty sleeping, nightmares -dizziness, drowsiness, clumsiness, or unsteadiness, a hangover effect -headache -nausea, vomiting This list may not describe all possible side effects. Call your doctor for medical advice about side effects. You may report side effects to FDA at 1-800-FDA-1088. Where should I keep my medicine? Keep out of the reach of children. This medicine can be abused. Keep your medicine in a safe place to protect it from theft. Do not share this medicine with anyone. Selling or giving away this medicine is dangerous and against the law. This medicine may cause accidental overdose and death if taken by other adults, children, or pets. Mix any unused medicine with a substance like cat litter or coffee grounds. Then throw the medicine away in a sealed container like a sealed bag or a coffee can with a lid. Do not use the medicine after the expiration date. Store at room temperature between 15 and 30 degrees C (59 and 86 degrees F). Protect from light. Keep container tightly closed. NOTE: This sheet is a summary. It may not cover all possible information. If you have questions about this medicine, talk to your doctor, pharmacist, or health care provider.    2016, Elsevier/Gold Standard. (2014-04-14 15:16:42)

## 2016-03-07 NOTE — Addendum Note (Signed)
Addended by: Larey Seat on: 03/07/2016 04:19 PM   Modules accepted: Orders

## 2016-03-09 ENCOUNTER — Other Ambulatory Visit: Payer: Self-pay | Admitting: Family Medicine

## 2016-03-09 DIAGNOSIS — Z1231 Encounter for screening mammogram for malignant neoplasm of breast: Secondary | ICD-10-CM

## 2016-03-21 ENCOUNTER — Telehealth: Payer: Self-pay | Admitting: Neurology

## 2016-03-21 MED ORDER — ZOLPIDEM TARTRATE 10 MG PO TABS: 10.0000 mg | ORAL_TABLET | Freq: Every evening | ORAL | 0 refills | Status: DC | PRN

## 2016-03-21 NOTE — Telephone Encounter (Signed)
lspke to Mrs. Tanya Griffith . Lets try a lower dose of diazepam - 2 mg. This may still help the bladder spasms without sending the paradox anxiety provoking effect.?  She felt no major improvement on valium and would like to d/c . Ambien is working, stick to it. Marland Kitchen

## 2016-03-21 NOTE — Telephone Encounter (Signed)
Patient called to advise, Dr. Brett Fairy prescribed diazepam (VALIUM) 5 MG tablet to help with sleep and bladder spasms, patient states it is helping with bladder spasms but is having the opposite affect for sleep, feels jittery is not sleeping, "was desperate and went back to Ambien", is not fond of Ambien, would like something else, other options that Dr. Brett Fairy could recommend, states she has discussed with Dr. Brett Fairy previously her preference for older drugs.

## 2016-03-22 NOTE — Telephone Encounter (Signed)
RX for [facility] faxed to [facility] in [place]. Received a receipt of confirmation.

## 2016-05-08 ENCOUNTER — Ambulatory Visit (HOSPITAL_BASED_OUTPATIENT_CLINIC_OR_DEPARTMENT_OTHER): Payer: Medicare PPO

## 2016-05-23 ENCOUNTER — Ambulatory Visit (HOSPITAL_BASED_OUTPATIENT_CLINIC_OR_DEPARTMENT_OTHER)
Admission: RE | Admit: 2016-05-23 | Discharge: 2016-05-23 | Disposition: A | Discharge status: Home / Self Care | Payer: Medicare PPO | Source: Ambulatory Visit | Attending: Family Medicine | Admitting: Family Medicine

## 2016-05-23 DIAGNOSIS — Z1231 Encounter for screening mammogram for malignant neoplasm of breast: Secondary | ICD-10-CM | POA: Diagnosis present

## 2016-06-05 ENCOUNTER — Telehealth: Payer: Self-pay | Admitting: Family Medicine

## 2016-06-05 ENCOUNTER — Other Ambulatory Visit: Payer: Self-pay

## 2016-06-05 MED ORDER — ZOLPIDEM TARTRATE 10 MG PO TABS: 10.0000 mg | ORAL_TABLET | Freq: Every evening | ORAL | 0 refills | Status: DC | PRN

## 2016-06-05 NOTE — Telephone Encounter (Signed)
Caller name:Yolette Raford Pitcher Relationship to patient: Can be reached:[phone] Pharmacy:[facility] on [address] [phone]  Reason for call:requesting refill on zolpidem 10mg 

## 2016-06-05 NOTE — Telephone Encounter (Signed)
Refill x1 

## 2016-06-05 NOTE — Telephone Encounter (Signed)
Last ov 01/14/16. Last fill 03/21/16 #30 0. Please advise.

## 2016-06-07 NOTE — Telephone Encounter (Signed)
Rx called to below pharmacy.

## 2016-06-07 NOTE — Telephone Encounter (Signed)
Patient called back to follow up on request for refill. Rx has not been called in. Plse adv

## 2016-06-07 NOTE — Telephone Encounter (Signed)
Notified pt. 

## 2016-07-07 ENCOUNTER — Telehealth: Payer: Self-pay | Admitting: Family Medicine

## 2016-07-07 NOTE — Telephone Encounter (Signed)
°  Relation to [id number] Call back [phone] Pharmacy: [facility], [place] [phone] (Phone) [phone] (Fax)     Reason for call:  Patient requesting a refill zolpidem (AMBIEN) 10 MG tablet, patient will run out Sunday, patient scheduled for 09/15/2016

## 2016-07-10 MED ORDER — ZOLPIDEM TARTRATE 10 MG PO TABS: 10.0000 mg | ORAL_TABLET | Freq: Every evening | ORAL | 0 refills | Status: DC | PRN

## 2016-07-10 NOTE — Telephone Encounter (Signed)
Pt called stating out of meds, req med be sent in today.

## 2016-07-10 NOTE — Telephone Encounter (Signed)
Refill x1 

## 2016-07-10 NOTE — Telephone Encounter (Signed)
Rx faxed. LB 

## 2016-07-10 NOTE — Telephone Encounter (Signed)
Last ov 01/14/16. Last fill 06/05/16 #30 0. Please advise.

## 2016-07-10 NOTE — Telephone Encounter (Signed)
Printed Rx Ambien, awaiting provider's signature. LB

## 2016-09-01 ENCOUNTER — Encounter: Payer: Medicare PPO | Admitting: Family Medicine

## 2016-09-06 DIAGNOSIS — H5200 Hypermetropia, unspecified eye: Secondary | ICD-10-CM | POA: Diagnosis not present

## 2016-09-07 ENCOUNTER — Telehealth: Payer: Self-pay | Admitting: Family Medicine

## 2016-09-07 NOTE — Telephone Encounter (Signed)
Relation to [id number] Call back number:[phone]   Reason for call:  Patient requesting a zolpidem (AMBIEN) 10 MG tablet refill to hold her over until 09/15/2016 follow up appointment, please advise

## 2016-09-07 NOTE — Telephone Encounter (Signed)
Refill x1 

## 2016-09-07 NOTE — Telephone Encounter (Signed)
Requesting:  zolpidem Contract   NONE UDS   NONE Last OV   01/14/2016--NEXT APPT IS 09/15/2016  Last Refill    #30 WITH 0 REFILLS ON 07/10/2016  Please Advise

## 2016-09-08 ENCOUNTER — Other Ambulatory Visit: Payer: Self-pay | Admitting: Family Medicine

## 2016-09-08 MED ORDER — ZOLPIDEM TARTRATE 10 MG PO TABS: 10.0000 mg | ORAL_TABLET | Freq: Every evening | ORAL | 0 refills | Status: DC | PRN

## 2016-09-08 NOTE — Telephone Encounter (Signed)
Faxed hardcopy for Zolpidem to [facility] [place]

## 2016-09-14 ENCOUNTER — Encounter: Payer: Medicare PPO | Admitting: Family Medicine

## 2016-09-14 ENCOUNTER — Telehealth: Payer: Self-pay | Admitting: *Deleted

## 2016-09-14 NOTE — Telephone Encounter (Signed)
Called patient to schedule AWV. Phone line busy, unable to leave message.

## 2016-09-15 ENCOUNTER — Telehealth: Payer: Self-pay | Admitting: Family Medicine

## 2016-09-15 ENCOUNTER — Telehealth: Payer: Self-pay | Admitting: *Deleted

## 2016-09-15 ENCOUNTER — Encounter: Payer: Medicare PPO | Admitting: Family Medicine

## 2016-09-15 NOTE — Telephone Encounter (Signed)
No charge. 

## 2016-09-15 NOTE — Telephone Encounter (Signed)
Patient canceled her appointment due to fear of getting the flu if she came into the office. Charge or no charge?

## 2016-09-15 NOTE — Telephone Encounter (Signed)
No charge-- I completely understand

## 2016-09-15 NOTE — Telephone Encounter (Signed)
Patient cancelled appt today.  She does not want to come in due to the flu outbreak.  She is rescheduling for April.  She was worried about getting refills for Ambien.

## 2016-09-18 DIAGNOSIS — Z1211 Encounter for screening for malignant neoplasm of colon: Secondary | ICD-10-CM | POA: Diagnosis not present

## 2016-09-18 DIAGNOSIS — Z1212 Encounter for screening for malignant neoplasm of rectum: Secondary | ICD-10-CM | POA: Diagnosis not present

## 2016-09-18 LAB — COLOGUARD: COLOGUARD: NEGATIVE

## 2016-09-22 ENCOUNTER — Encounter: Payer: Self-pay | Admitting: Family Medicine

## 2016-10-05 ENCOUNTER — Other Ambulatory Visit: Payer: Self-pay | Admitting: Family Medicine

## 2016-10-05 NOTE — Telephone Encounter (Signed)
Faxed hardcopy for zolpidem to [facility]

## 2016-10-05 NOTE — Telephone Encounter (Signed)
Last refill for zolpidem on 09/08/16  #30 no refills Last office visit 01/14/2016-----next scheduled appt is 11/13/2016 NO UDS/no Contract

## 2016-10-10 ENCOUNTER — Ambulatory Visit (INDEPENDENT_AMBULATORY_CARE_PROVIDER_SITE_OTHER): Payer: Medicare HMO | Admitting: Family Medicine

## 2016-10-10 ENCOUNTER — Encounter: Payer: Self-pay | Admitting: Family Medicine

## 2016-10-10 VITALS — BP 122/68 | HR 72 | Temp 97.7°F | Resp 16 | Ht 60.0 in | Wt 162.6 lb

## 2016-10-10 DIAGNOSIS — S46911A Strain of unspecified muscle, fascia and tendon at shoulder and upper arm level, right arm, initial encounter: Secondary | ICD-10-CM | POA: Diagnosis not present

## 2016-10-10 MED ORDER — CYCLOBENZAPRINE HCL 10 MG PO TABS: 10.0000 mg | ORAL_TABLET | Freq: Three times a day (TID) | ORAL | 0 refills | Status: DC | PRN

## 2016-10-10 MED ORDER — MELOXICAM 15 MG PO TABS: 15.0000 mg | ORAL_TABLET | Freq: Every day | ORAL | 0 refills | Status: DC

## 2016-10-10 NOTE — Progress Notes (Signed)
Pre visit review using our clinic review tool, if applicable. No additional management support is needed unless otherwise documented below in the visit note. 

## 2016-10-10 NOTE — Progress Notes (Signed)
00atient ID: Tanya Griffith, female   DOB: 11-14-1955, 61 y.o.   MRN: [id number]             Subjective:     Patient ID: Tanya Griffith, female    DOB: [birth year], 61 y.o.   MRN: [id number]  Chief Complaint  Patient presents with  . Fall    3 weeks ago    Fall  Incident onset: 3 weeks ago. The fall occurred while walking (tripped over a root in the woods). The point of impact was the right shoulder. The pain is present in the neck and right shoulder. Pertinent negatives include no abdominal pain, fever, headaches, hematuria, loss of consciousness, nausea or vomiting. She has tried NSAID for the symptoms. The treatment provided mild relief.   Patient is in today for fall in the woods about 3 weeks ago  Patient Care Team: Ann Held, DO as PCP - General (Family Medicine) [facility] Grant Fontana., MD as Attending Physician (Urology)   Past Medical History:  Diagnosis Date  . Abdominal wall ulcer ([place])   . Back problem   . Chicken pox   . Environmental allergies   . IC (interstitial cystitis)   . Interstitial cystitis   . Mitral valve prolapse   . Osteoarthritis (arthritis due to wear and tear of joints)     Past Surgical History:  Procedure Laterality Date  . ABDOMINAL HYSTERECTOMY    . BLADDER SURGERY    . BLADDER SURGERY    . CESAREAN SECTION    . KNEE SURGERY Right   . NASAL SEPTUM SURGERY    . OOPHORECTOMY    . TONSILLECTOMY      Family History  Problem Relation Age of Onset  . Dementia Mother     Vascular Dementia  . Alzheimer's disease Mother   . Cancer Sister     skin  . Arthritis Mother   . Arthritis Brother   . Arthritis Maternal Grandfather   . Arthritis Maternal Aunt   . Hyperlipidemia Mother   . Transient ischemic attack Mother   . Heart attack Paternal Grandfather   . Transient ischemic attack Maternal Aunt   . Hypertension Brother   . Kidney disease Mother 64    Kidney Failure  . Diabetes Maternal Aunt   . Diabetes  Brother     Medication induced  . Breast cancer      Niece--Double Mastectomy    Social History   Social History  . Marital status: Married    Spouse name: N/A  . Number of children: N/A  . Years of education: N/A   Occupational History  . Not on file.   Social History Main Topics  . Smoking status: Never Smoker  . Smokeless tobacco: Never Used  . Alcohol use No  . Drug use: No  . Sexual activity: Not on file   Other Topics Concern  . Not on file   Social History Narrative  . No narrative on file    Outpatient Medications Prior to Visit  Medication Sig Dispense Refill  . BIOTIN PO Take by mouth.    . Calcium-Magnesium (CAL-MAG PO) Take 1 tablet by mouth.    . Flaxseed, Linseed, (FLAX SEED OIL) 1000 MG CAPS Take by mouth.    . Methylsulfonylmethane (MSM) 1000 MG TABS Take by mouth.    . Multiple Vitamins-Minerals (CENTRUM SILVER PO) Take by mouth.    . Omega-3 Fatty Acids (FISH OIL) 1000 MG CAPS Take by mouth.    Marland Kitchen  Probiotic Product (PROBIOTIC ADVANCED PO) Take by mouth.    . zolpidem (AMBIEN) 10 MG tablet take 1 tablet by mouth at bedtime if needed for sleep 30 tablet 0   No facility-administered medications prior to visit.     Allergies  Allergen Reactions  . Other Other (See Comments)    Pain meds--vomiting  . Sulfa Antibiotics     Review of Systems  Constitutional: Negative for chills, fever and malaise/fatigue.  HENT: Negative for congestion and hearing loss.   Eyes: Negative for discharge.  Respiratory: Negative for cough, sputum production and shortness of breath.   Cardiovascular: Negative for chest pain, palpitations and leg swelling.  Gastrointestinal: Negative for abdominal pain, blood in stool, constipation, diarrhea, heartburn, nausea and vomiting.  Genitourinary: Negative for dysuria, frequency, hematuria and urgency.  Musculoskeletal: Positive for falls, myalgias and neck pain. Negative for back pain.  Skin: Negative for rash.  Neurological:  Negative for dizziness, sensory change, loss of consciousness, weakness and headaches.  Endo/Heme/Allergies: Negative for environmental allergies. Does not bruise/bleed easily.  Psychiatric/Behavioral: Negative for depression and suicidal ideas. The patient is not nervous/anxious and does not have insomnia.        Objective:    Physical Exam  Constitutional: She is oriented to person, place, and time. She appears well-developed and well-nourished.  HENT:  Head: Normocephalic and atraumatic.  Eyes: Conjunctivae and EOM are normal.  Neck: Normal range of motion. Neck supple. No JVD present. Carotid bruit is not present. No thyromegaly present.  Cardiovascular: Normal rate, regular rhythm and normal heart sounds.   No murmur heard. Pulmonary/Chest: Effort normal and breath sounds normal. No respiratory distress. She has no wheezes. She has no rales. She exhibits no tenderness.  Musculoskeletal: Normal range of motion. She exhibits tenderness. She exhibits no edema.       Arms: Neurological: She is alert and oriented to person, place, and time.  Psychiatric: She has a normal mood and affect.  Nursing note and vitals reviewed.   BP 122/68 (BP Location: Left Arm, Cuff Size: Normal)   Pulse 72   Temp 97.7 F (36.5 C) (Oral)   Resp 16   Ht 5' (1.524 m)   Wt 162 lb 9.6 oz (73.8 kg)   SpO2 98%   BMI 31.76 kg/m  Wt Readings from Last 3 Encounters:  10/10/16 162 lb 9.6 oz (73.8 kg)  03/07/16 172 lb (78 kg)  01/14/16 178 lb (80.7 kg)     Lab Results  Component Value Date   WBC 5.8 01/14/2016   HGB 12.4 01/14/2016   HCT 37.0 01/14/2016   PLT 328 01/14/2016   GLUCOSE 143 (H) 01/14/2016   CHOL 194 09/07/2015   TRIG 175.0 (H) 09/07/2015   HDL 53.50 09/07/2015   LDLCALC 106 (H) 09/07/2015   ALT 12 01/14/2016   AST 18 01/14/2016   NA 140 01/14/2016   K 4.0 01/14/2016   CL 105 01/14/2016   CREATININE 0.71 01/14/2016   BUN 18 01/14/2016   CO2 24 01/14/2016   TSH 4.07 09/07/2015    HGBA1C 5.6 06/08/2014    Lab Results  Component Value Date   TSH 4.07 09/07/2015   Lab Results  Component Value Date   WBC 5.8 01/14/2016   HGB 12.4 01/14/2016   HCT 37.0 01/14/2016   MCV 99.2 01/14/2016   PLT 328 01/14/2016   Lab Results  Component Value Date   NA 140 01/14/2016   K 4.0 01/14/2016   CO2 24 01/14/2016  GLUCOSE 143 (H) 01/14/2016   BUN 18 01/14/2016   CREATININE 0.71 01/14/2016   BILITOT 0.3 01/14/2016   ALKPHOS 65 01/14/2016   AST 18 01/14/2016   ALT 12 01/14/2016   PROT 6.6 01/14/2016   ALBUMIN 3.7 01/14/2016   CALCIUM 8.9 01/14/2016   GFR 86.26 09/07/2015   Lab Results  Component Value Date   CHOL 194 09/07/2015   Lab Results  Component Value Date   HDL 53.50 09/07/2015   Lab Results  Component Value Date   LDLCALC 106 (H) 09/07/2015   Lab Results  Component Value Date   TRIG 175.0 (H) 09/07/2015   Lab Results  Component Value Date   CHOLHDL 4 09/07/2015   Lab Results  Component Value Date   HGBA1C 5.6 06/08/2014       Assessment & Plan:   Problem List Items Addressed This Visit    None    Visit Diagnoses    Strain of right shoulder, initial encounter    -  Primary   Relevant Medications   meloxicam (MOBIC) 15 MG tablet   cyclobenzaprine (FLEXERIL) 10 MG tablet    alt ice and heat  Rest rto 7-10 days if no better or sooner if it does not work.   I am having Ms. Eugene start on meloxicam and cyclobenzaprine. I am also having her maintain her Fish Oil, MSM, Calcium-Magnesium (CAL-MAG PO), Flax Seed Oil, Multiple Vitamins-Minerals (CENTRUM SILVER PO), Probiotic Product (PROBIOTIC ADVANCED PO), BIOTIN PO, and zolpidem.  Meds ordered this encounter  Medications  . meloxicam (MOBIC) 15 MG tablet    Sig: Take 1 tablet (15 mg total) by mouth daily.    Dispense:  30 tablet    Refill:  0  . cyclobenzaprine (FLEXERIL) 10 MG tablet    Sig: Take 1 tablet (10 mg total) by mouth 3 (three) times daily as needed for muscle  spasms.    Dispense:  30 tablet    Refill:  0    CMA served as scribe during this visit. History, Physical and Plan performed by medical provider. Documentation and orders reviewed and attested to.  Ann Held, DO

## 2016-10-10 NOTE — Patient Instructions (Signed)
Shoulder Pain Many things can cause shoulder pain, including:  An injury to the area.  Overuse of the shoulder.  Arthritis. The source of the pain can be:  Inflammation.  An injury to the shoulder joint.  An injury to a tendon, ligament, or bone. Follow these instructions at home: Take these actions to help with your pain:  Squeeze a soft ball or a foam pad as much as possible. This helps to keep the shoulder from swelling. It also helps to strengthen the arm.  Take over-the-counter and prescription medicines only as told by your health care provider.  If directed, apply ice to the area:  Put ice in a plastic bag.  Place a towel between your skin and the bag.  Leave the ice on for 20 minutes, 2-3 times per day. Stop applying ice if it does not help with the pain.  If you were given a shoulder sling or immobilizer:  Wear it as told.  Remove it to shower or bathe.  Move your arm as little as possible, but keep your hand moving to prevent swelling. Contact a health care provider if:  Your pain gets worse.  Your pain is not relieved with medicines.  New pain develops in your arm, hand, or fingers. Get help right away if:  Your arm, hand, or fingers:  Tingle.  Become numb.  Become swollen.  Become painful.  Turn white or blue. This information is not intended to replace advice given to you by your health care provider. Make sure you discuss any questions you have with your health care provider. Document Released: 05/03/2005 Document Revised: 03/19/2016 Document Reviewed: 11/16/2014 Elsevier Interactive Patient Education  2017 Elsevier Inc.  

## 2016-10-11 ENCOUNTER — Telehealth: Payer: Self-pay | Admitting: Family Medicine

## 2016-10-11 NOTE — Telephone Encounter (Signed)
Relation to QQ:[facility] Call back number:[phone]  Reason for call:  Patient inquiring about cologuard results, please advise

## 2016-10-12 NOTE — Telephone Encounter (Signed)
Called cologuard at [phone] Results were negative. They will fax over those results to our fax number in the back. Will copy/mail her a copy/send to scan to put in her chart. Also documented in health maintenance

## 2016-11-06 ENCOUNTER — Other Ambulatory Visit: Payer: Self-pay | Admitting: Family Medicine

## 2016-11-06 NOTE — Telephone Encounter (Signed)
Faxed hardcopy for Zolpidem to [facility]

## 2016-11-06 NOTE — Telephone Encounter (Signed)
Last office visit on 10/10/16 Next office visit  11/13/16 Last refill on 10/05/2016 No UDS/Contract

## 2016-11-06 NOTE — Telephone Encounter (Signed)
Caller name: Tanya Griffith Relationship to patient: self Can be reached: [phone] Pharmacy: [facility], [place]  Reason for call: pt will take last [employer] She is requesting we refill as she is scheduled for cpe next Monday 11/13/16

## 2016-11-10 NOTE — Progress Notes (Signed)
Pre visit review using our clinic review tool, if applicable. No additional management support is needed unless otherwise documented below in the visit note. 

## 2016-11-10 NOTE — Progress Notes (Signed)
Subjective:   Tanya Griffith is a 61 y.o. female who presents for an Initial Medicare Annual Wellness Visit.  Review of Systems    No ROS.  Medicare Wellness Visit. Cardiac Risk Factors include: advanced age (>4men, >67 women) Sleep patterns: Wakes almost every hr to urinate. Has seen several MDs to address. Home Safety/Smoke Alarms:  Feels safe in home. Smoke alarms in place.  Living environment; residence and Firearm Safety:  Lives with husband and dog. No guns. Seat Belt Safety/Bike Helmet: Wears seat belt.   Counseling:   Eye Exam- OTC reading glasses. Eye doctor annually. Dental- Dr.Swartz annually.  Female:   Pap- Hysterectomy.      Mammo- Last 05/23/16:BI-RADS CATEGORY  1: Negative.       Dexa scan-  Last 06/10/14: Osteopenia. Ordered today. CCS- Cologuard 09/18/16: negative    Objective:    Today's Vitals   11/13/16 1355  BP: 122/72  Pulse: 72  Resp: 16  Temp: 97.9 F (36.6 C)  TempSrc: Oral  SpO2: 98%  Weight: 158 lb (71.7 kg)  Height: 5' (1.524 m)   Body mass index is 30.86 kg/m.   Current Medications (verified) Outpatient Encounter Prescriptions as of 11/13/2016  Medication Sig  . BIOTIN PO Take by mouth.  . Calcium-Magnesium (CAL-MAG PO) Take 1 tablet by mouth.  . Flaxseed, Linseed, (FLAX SEED OIL) 1000 MG CAPS Take by mouth.  . Methylsulfonylmethane (MSM) 1000 MG TABS Take by mouth.  . Multiple Vitamins-Minerals (CENTRUM SILVER PO) Take by mouth.  . Omega-3 Fatty Acids (FISH OIL) 1000 MG CAPS Take by mouth.  . Probiotic Product (PROBIOTIC ADVANCED PO) Take by mouth.  . zolpidem (AMBIEN) 10 MG tablet take 1 tablet by mouth at bedtime if needed for sleep  . cyclobenzaprine (FLEXERIL) 10 MG tablet Take 1 tablet (10 mg total) by mouth 3 (three) times daily as needed for muscle spasms. (Patient not taking: Reported on 11/13/2016)  . fluticasone (FLONASE) 50 MCG/ACT nasal spray Place 2 sprays into both nostrils daily.  . meclizine (ANTIVERT) 25 MG tablet  Take 1 tablet (25 mg total) by mouth 3 (three) times daily as needed for dizziness.  . [DISCONTINUED] meloxicam (MOBIC) 15 MG tablet Take 1 tablet (15 mg total) by mouth daily. (Patient not taking: Reported on 11/13/2016)   No facility-administered encounter medications on file as of 11/13/2016.     Allergies (verified) Other and Sulfa antibiotics   History: Past Medical History:  Diagnosis Date  . Abdominal wall ulcer ([place])   . Back problem   . Chicken pox   . Environmental allergies   . IC (interstitial cystitis)   . Interstitial cystitis   . Mitral valve prolapse   . Osteoarthritis (arthritis due to wear and tear of joints)    Past Surgical History:  Procedure Laterality Date  . ABDOMINAL HYSTERECTOMY    . BLADDER SURGERY    . BLADDER SURGERY    . CESAREAN SECTION    . KNEE SURGERY Right   . NASAL SEPTUM SURGERY    . OOPHORECTOMY    . TONSILLECTOMY     Family History  Problem Relation Age of Onset  . Dementia Mother     Vascular Dementia  . Alzheimer's disease Mother   . Arthritis Mother   . Hyperlipidemia Mother   . Transient ischemic attack Mother   . Kidney disease Mother 79    Kidney Failure  . Cancer Sister     skin  . Arthritis Brother   .  Arthritis Maternal Grandfather   . Arthritis Maternal Aunt   . Heart attack Paternal Grandfather   . Transient ischemic attack Maternal Aunt   . Hypertension Brother   . Diabetes Maternal Aunt   . Diabetes Brother     Medication induced  . Breast cancer      Niece--Double Mastectomy   Social History   Occupational History  . Not on file.   Social History Main Topics  . Smoking status: Never Smoker  . Smokeless tobacco: Never Used  . Alcohol use No  . Drug use: No  . Sexual activity: Yes    Tobacco Counseling Counseling given: Not Answered   Activities of Daily Living In your present state of health, do you have any difficulty performing the following activities: 11/13/2016  Hearing? N  Vision? N    Difficulty concentrating or making decisions? N  Walking or climbing stairs? N  Dressing or bathing? N  Doing errands, shopping? N  Preparing Food and eating ? N  Using the Toilet? N  In the past six months, have you accidently leaked urine? N  Do you have problems with loss of bowel control? N  Managing your Medications? N  Managing your Finances? N  Housekeeping or managing your Housekeeping? N  Some recent data might be hidden    Immunizations and Health Maintenance  There is no immunization history on file for this patient. Health Maintenance Due  Topic Date Due  . TETANUS/TDAP  02/02/1974    Patient Care Team: Ann Held, DO as PCP - General (Family Medicine) [facility] Grant Fontana., MD as Attending Physician (Urology)  Indicate any recent Medical Services you may have received from other than [facility] providers in the past year (date may be approximate).     Assessment:   This is a routine wellness examination for Tanya Griffith. Physical assessment deferred to PCP.  Hearing/Vision screen Hearing Screening Comments: Able to hear conversational tones w/o difficulty. No issues reported.   Vision Screening Comments: Pt states he just had eye exam.  Dietary issues and exercise activities discussed: Current Exercise Habits: Home exercise routine, Time (Minutes): 30, Frequency (Times/Week): 5, Weekly Exercise (Minutes/Week): 150, Intensity: Mild   Diet (meal preparation, eat out, water intake, caffeinated beverages, dairy products, fruits and vegetables): in general, a "healthy" diet         Goals    . Lose weight through exercise.    . To be more positive      Depression Screen PHQ 2/9 Scores 11/13/2016  PHQ - 2 Score 0    Fall Risk Fall Risk  11/13/2016  Falls in the past year? Yes  Number falls in past yr: 1  Injury with Fall? Yes    Cognitive Function: MMSE - Mini Mental State Exam 11/13/2016  Orientation to time 5  Orientation to Place 5  Registration 3   Attention/ Calculation 5  Recall 3  Language- name 2 objects 2  Language- repeat 1  Language- follow 3 step command 3  Language- read & follow direction 1  Write a sentence 1  Copy design 1  Total score 30        Screening Tests Health Maintenance  Topic Date Due  . TETANUS/TDAP  02/02/1974  . INFLUENZA VACCINE  03/07/2017  . MAMMOGRAM  05/23/2018  . Fecal DNA (Cologuard)  09/19/2019  . Hepatitis C Screening  Completed  . HIV Screening  Completed  Dictation #1 [id number]  [id number]     Plan:  Follow up with Dr.Lowne as directed.   Continue to eat heart healthy diet (full of fruits, vegetables, whole grains, lean protein, water--limit salt, fat, and sugar intake) and increase physical activity as tolerated.  Continue doing brain stimulating activities (puzzles, reading, adult coloring books, staying active) to keep memory sharp.    During the course of the visit, Kaitlynn was educated and counseled about the following appropriate screening and preventive services:   Vaccines to include Pneumoccal, Influenza, Td,  HCV  Cardiovascular disease screening  Colorectal cancer screening  Bone density screening  Diabetes screening  Glaucoma screening  Mammography/PAP  Nutrition counseling   Patient Instructions (the written plan) were given to the patient.    Shela Nevin, [place]   11/13/2016

## 2016-11-13 ENCOUNTER — Encounter: Payer: Self-pay | Admitting: Family Medicine

## 2016-11-13 ENCOUNTER — Ambulatory Visit (INDEPENDENT_AMBULATORY_CARE_PROVIDER_SITE_OTHER): Payer: Medicare HMO | Admitting: Family Medicine

## 2016-11-13 VITALS — BP 122/72 | HR 72 | Temp 97.9°F | Resp 16 | Ht 60.0 in | Wt 158.0 lb

## 2016-11-13 DIAGNOSIS — Z8639 Personal history of other endocrine, nutritional and metabolic disease: Secondary | ICD-10-CM

## 2016-11-13 DIAGNOSIS — Z0001 Encounter for general adult medical examination with abnormal findings: Secondary | ICD-10-CM | POA: Diagnosis not present

## 2016-11-13 DIAGNOSIS — Z780 Asymptomatic menopausal state: Secondary | ICD-10-CM | POA: Diagnosis not present

## 2016-11-13 DIAGNOSIS — Z0000 Encounter for general adult medical examination without abnormal findings: Secondary | ICD-10-CM | POA: Diagnosis not present

## 2016-11-13 DIAGNOSIS — R42 Dizziness and giddiness: Secondary | ICD-10-CM

## 2016-11-13 MED ORDER — MECLIZINE HCL 25 MG PO TABS: 25.0000 mg | ORAL_TABLET | Freq: Three times a day (TID) | ORAL | 0 refills | Status: DC | PRN

## 2016-11-13 MED ORDER — FLUTICASONE PROPIONATE 50 MCG/ACT NA SUSP: 2.0000 | Freq: Every day | NASAL | 6 refills | Status: DC

## 2016-11-13 NOTE — Patient Instructions (Addendum)
Continue to eat heart healthy diet (full of fruits, vegetables, whole grains, lean protein, water--limit salt, fat, and sugar intake) and increase physical activity as tolerated.  Continue doing brain stimulating activities (puzzles, reading, adult coloring books, staying active) to keep memory sharp.   Vertigo Vertigo is the feeling that you or your surroundings are moving when they are not. Vertigo can be dangerous if it occurs while you are doing something that could endanger you or others, such as driving. What are the causes? This condition is caused by a disturbance in the signals that are sent by your body's sensory systems to your brain. Different causes of a disturbance can lead to vertigo, including:  Infections, especially in the inner ear.  A bad reaction to a drug, or misuse of alcohol and medicines.  Withdrawal from drugs or alcohol.  Quickly changing positions, as when lying down or rolling over in bed.  Migraine headaches.  Decreased blood flow to the brain.  Decreased blood pressure.  Increased pressure in the brain from a head or neck injury, stroke, infection, tumor, or bleeding.  Central nervous system disorders. What are the signs or symptoms? Symptoms of this condition usually occur when you move your head or your eyes in different directions. Symptoms may start suddenly, and they usually last for less than a minute. Symptoms may include:  Loss of balance and falling.  Feeling like you are spinning or moving.  Feeling like your surroundings are spinning or moving.  Nausea and vomiting.  Blurred vision or double vision.  Difficulty hearing.  Slurred speech.  Dizziness.  Involuntary eye movement (nystagmus). Symptoms can be mild and cause only slight annoyance, or they can be severe and interfere with daily life. Episodes of vertigo may return (recur) over time, and they are often triggered by certain movements. Symptoms may improve over time. How is  this diagnosed? This condition may be diagnosed based on medical history and the quality of your nystagmus. Your health care provider may test your eye movements by asking you to quickly change positions to trigger the nystagmus. This may be called the Dix-Hallpike test, head thrust test, or roll test. You may be referred to a health care provider who specializes in ear, nose, and throat (ENT) problems (otolaryngologist) or a provider who specializes in disorders of the central nervous system (neurologist). You may have additional testing, including:  A physical exam.  Blood tests.  MRI.  A CT scan.  An electrocardiogram (ECG). This records electrical activity in your heart.  An electroencephalogram (EEG). This records electrical activity in your brain.  Hearing tests. How is this treated? Treatment for this condition depends on the cause and the severity of the symptoms. Treatment options include:  Medicines to treat nausea or vertigo. These are usually used for severe cases. Some medicines that are used to treat other conditions may also reduce or eliminate vertigo symptoms. These include:  Medicines that control allergies (antihistamines).  Medicines that control seizures (anticonvulsants).  Medicines that relieve depression (antidepressants).  Medicines that relieve anxiety (sedatives).  Head movements to adjust your inner ear back to normal. If your vertigo is caused by an ear problem, your health care provider may recommend certain movements to correct the problem.  Surgery. This is rare. Follow these instructions at home: Safety   Move slowly.Avoid sudden body or head movements.  Avoid driving.  Avoid operating heavy machinery.  Avoid doing any tasks that would cause danger to you or others if you would have  a vertigo episode during the task.  If you have trouble walking or keeping your balance, try using a cane for stability. If you feel dizzy or unstable, sit down  right away.  Return to your normal activities as told by your health care provider. Ask your health care provider what activities are safe for you. General instructions   Take over-the-counter and prescription medicines only as told by your health care provider.  Avoid certain positions or movements as told by your health care provider.  Drink enough fluid to keep your urine clear or pale yellow.  Keep all follow-up visits as told by your health care provider. This is important. Contact a health care provider if:  Your medicines do not relieve your vertigo or they make it worse.  You have a fever.  Your condition gets worse or you develop new symptoms.  Your family or friends notice any behavioral changes.  Your nausea or vomiting gets worse.  You have numbness or a "pins and needles" sensation in part of your body. Get help right away if:  You have difficulty moving or speaking.  You are always dizzy.  You faint.  You develop severe headaches.  You have weakness in your hands, arms, or legs.  You have changes in your hearing or vision.  You develop a stiff neck.  You develop sensitivity to light. This information is not intended to replace advice given to you by your health care provider. Make sure you discuss any questions you have with your health care provider. Document Released: 05/03/2005 Document Revised: 01/05/2016 Document Reviewed: 11/16/2014 Elsevier Interactive Patient Education  2017 [employer].

## 2016-11-13 NOTE — Progress Notes (Signed)
Subjective:  I acted as a Education administrator for Dr. Royden Purl, LPN    Patient ID: Tanya Griffith, female    DOB: 06/12/1956, 61 y.o.   MRN: [id number]  Chief Complaint  Patient presents with  . Annual Exam    fasting.  . Medicare Wellness    with RN    HPI  Patient is in today for annual physical. Patient report having recurrent vertigo and nausea issues for over a week ago. Patient report she have to keep still to keep from feeling dizzy. Patient report when she was in her twenties she experienced vertigo and nausea, she was seen by an ENT and they told her she had a inner ear issue.    Patient Care Team: Ann Held, DO as PCP - General (Family Medicine) [facility] Grant Fontana., MD as Attending Physician (Urology)   Past Medical History:  Diagnosis Date  . Abdominal wall ulcer ([place])   . Back problem   . Chicken pox   . Environmental allergies   . IC (interstitial cystitis)   . Interstitial cystitis   . Mitral valve prolapse   . Osteoarthritis (arthritis due to wear and tear of joints)     Past Surgical History:  Procedure Laterality Date  . ABDOMINAL HYSTERECTOMY    . BLADDER SURGERY    . BLADDER SURGERY    . CESAREAN SECTION    . KNEE SURGERY Right   . NASAL SEPTUM SURGERY    . OOPHORECTOMY    . TONSILLECTOMY      Family History  Problem Relation Age of Onset  . Dementia Mother     Vascular Dementia  . Alzheimer's disease Mother   . Arthritis Mother   . Hyperlipidemia Mother   . Transient ischemic attack Mother   . Kidney disease Mother 31    Kidney Failure  . Cancer Sister     skin  . Arthritis Brother   . Arthritis Maternal Grandfather   . Arthritis Maternal Aunt   . Heart attack Paternal Grandfather   . Transient ischemic attack Maternal Aunt   . Hypertension Brother   . Diabetes Maternal Aunt   . Diabetes Brother     Medication induced  . Breast cancer      Niece--Double Mastectomy    Social History   Social History  . Marital  status: Married    Spouse name: N/A  . Number of children: N/A  . Years of education: N/A   Occupational History  . Not on file.   Social History Main Topics  . Smoking status: Never Smoker  . Smokeless tobacco: Never Used  . Alcohol use No  . Drug use: No  . Sexual activity: Yes   Other Topics Concern  . Not on file   Social History Narrative  . No narrative on file    Outpatient Medications Prior to Visit  Medication Sig Dispense Refill  . BIOTIN PO Take by mouth.    . Calcium-Magnesium (CAL-MAG PO) Take 1 tablet by mouth.    . Flaxseed, Linseed, (FLAX SEED OIL) 1000 MG CAPS Take by mouth.    . Methylsulfonylmethane (MSM) 1000 MG TABS Take by mouth.    . Multiple Vitamins-Minerals (CENTRUM SILVER PO) Take by mouth.    . Omega-3 Fatty Acids (FISH OIL) 1000 MG CAPS Take by mouth.    . Probiotic Product (PROBIOTIC ADVANCED PO) Take by mouth.    . zolpidem (AMBIEN) 10 MG tablet take 1 tablet by  mouth at bedtime if needed for sleep 30 tablet 0  . cyclobenzaprine (FLEXERIL) 10 MG tablet Take 1 tablet (10 mg total) by mouth 3 (three) times daily as needed for muscle spasms. (Patient not taking: Reported on 11/13/2016) 30 tablet 0  . meloxicam (MOBIC) 15 MG tablet Take 1 tablet (15 mg total) by mouth daily. (Patient not taking: Reported on 11/13/2016) 30 tablet 0   No facility-administered medications prior to visit.     Allergies  Allergen Reactions  . Other Other (See Comments)    Pain meds--vomiting  . Sulfa Antibiotics     Review of Systems  Constitutional: Negative for fever.  HENT: Negative for congestion.   Eyes: Negative for blurred vision.  Respiratory: Negative for cough.   Cardiovascular: Negative for chest pain and palpitations.  Gastrointestinal: Negative for vomiting.  Musculoskeletal: Negative for back pain.  Skin: Negative for rash.  Neurological: Negative for loss of consciousness and headaches.       Objective:    Physical Exam  Constitutional: She  is oriented to person, place, and time. She appears well-developed and well-nourished. No distress.  HENT:  Head: Normocephalic and atraumatic.  Eyes: Conjunctivae are normal. Pupils are equal, round, and reactive to light.  Neck: Normal range of motion. No thyromegaly present.  Cardiovascular: Normal rate and regular rhythm.   Heart murmur 2 out of 6  Pulmonary/Chest: Effort normal and breath sounds normal. She has no wheezes.  Abdominal: Soft. Bowel sounds are normal. There is no tenderness.  Musculoskeletal: Normal range of motion. She exhibits no edema or deformity.  Neurological: She is alert and oriented to person, place, and time.  Skin: Skin is warm and dry. She is not diaphoretic.  Psychiatric: She has a normal mood and affect.    BP 122/72 (BP Location: Left Arm, Patient Position: Sitting, Cuff Size: Normal)   Pulse 72   Temp 97.9 F (36.6 C) (Oral)   Resp 16   Ht 5' (1.524 m)   Wt 158 lb (71.7 kg)   SpO2 98%   BMI 30.86 kg/m  Wt Readings from Last 3 Encounters:  11/13/16 158 lb (71.7 kg)  10/10/16 162 lb 9.6 oz (73.8 kg)  03/07/16 172 lb (78 kg)   BP Readings from Last 3 Encounters:  11/13/16 122/72  10/10/16 122/68  03/07/16 132/72      There is no immunization history on file for this patient.  Health Maintenance  Topic Date Due  . TETANUS/TDAP  02/02/1974  . INFLUENZA VACCINE  03/07/2017  . MAMMOGRAM  05/23/2018  . Fecal DNA (Cologuard)  09/19/2019  . Hepatitis C Screening  Completed  . HIV Screening  Completed    Lab Results  Component Value Date   WBC 6.8 11/13/2016   HGB 13.8 11/13/2016   HCT 40.4 11/13/2016   PLT 329.0 11/13/2016   GLUCOSE 86 11/13/2016   CHOL 232 (H) 11/13/2016   TRIG 123.0 11/13/2016   HDL 64.30 11/13/2016   LDLCALC 143 (H) 11/13/2016   ALT 12 11/13/2016   AST 22 11/13/2016   NA 138 11/13/2016   K 4.9 11/13/2016   CL 104 11/13/2016   CREATININE 0.74 11/13/2016   BUN 17 11/13/2016   CO2 27 11/13/2016   TSH 1.87  11/13/2016   HGBA1C 5.6 06/08/2014    Lab Results  Component Value Date   TSH 1.87 11/13/2016   Lab Results  Component Value Date   WBC 6.8 11/13/2016   HGB 13.8 11/13/2016   HCT  40.4 11/13/2016   MCV 100.5 (H) 11/13/2016   PLT 329.0 11/13/2016   Lab Results  Component Value Date   NA 138 11/13/2016   K 4.9 11/13/2016   CO2 27 11/13/2016   GLUCOSE 86 11/13/2016   BUN 17 11/13/2016   CREATININE 0.74 11/13/2016   BILITOT 0.4 11/13/2016   ALKPHOS 57 11/13/2016   AST 22 11/13/2016   ALT 12 11/13/2016   PROT 7.5 11/13/2016   ALBUMIN 4.3 11/13/2016   CALCIUM 10.0 11/13/2016   GFR 84.58 11/13/2016   Lab Results  Component Value Date   CHOL 232 (H) 11/13/2016   Lab Results  Component Value Date   HDL 64.30 11/13/2016   Lab Results  Component Value Date   LDLCALC 143 (H) 11/13/2016   Lab Results  Component Value Date   TRIG 123.0 11/13/2016   Lab Results  Component Value Date   CHOLHDL 4 11/13/2016   Lab Results  Component Value Date   HGBA1C 5.6 06/08/2014         Assessment & Plan:   Problem List Items Addressed This Visit      Unprioritized   Preventative health care    ghm utd Check labs See AVS      Relevant Orders   CBC (Completed)   Comprehensive metabolic panel (Completed)   Lipid panel (Completed)    Other Visit Diagnoses    Dizzy    -  Primary   Relevant Medications   fluticasone (FLONASE) 50 MCG/ACT nasal spray   meclizine (ANTIVERT) 25 MG tablet   Other Relevant Orders   EKG 12-Lead (Completed)   ECHOCARDIOGRAM COMPLETE   TSH (Completed)   Hx of non anemic vitamin B12 deficiency       Relevant Orders   Vitamin B12 (Completed)   Postmenopausal       Relevant Orders   DG Bone Density   Encounter for Medicare annual wellness exam          I have discontinued Ms. Bawa's meloxicam. I am also having her start on fluticasone and meclizine. Additionally, I am having her maintain her Fish Oil, MSM, Calcium-Magnesium (CAL-MAG  PO), Flax Seed Oil, Multiple Vitamins-Minerals (CENTRUM SILVER PO), Probiotic Product (PROBIOTIC ADVANCED PO), BIOTIN PO, cyclobenzaprine, and zolpidem.  Meds ordered this encounter  Medications  . fluticasone (FLONASE) 50 MCG/ACT nasal spray    Sig: Place 2 sprays into both nostrils daily.    Dispense:  16 g    Refill:  6  . meclizine (ANTIVERT) 25 MG tablet    Sig: Take 1 tablet (25 mg total) by mouth 3 (three) times daily as needed for dizziness.    Dispense:  30 tablet    Refill:  0    CMA served as scribe during this visit. History, Physical and Plan performed by medical provider. Documentation and orders reviewed and attested to.  Ann Held, DO   Patient ID: TAMESHA ELLERBROCK, female   DOB: 04/27/1956, 61 y.o.   MRN: [id number]

## 2016-11-14 LAB — COMPREHENSIVE METABOLIC PANEL
ALBUMIN: 4.3 g/dL (ref 3.5–5.2)
ALK PHOS: 57 U/L (ref 39–117)
ALT: 12 U/L (ref 0–35)
AST: 22 U/L (ref 0–37)
BUN: 17 mg/dL (ref 6–23)
CALCIUM: 10 mg/dL (ref 8.4–10.5)
CHLORIDE: 104 meq/L (ref 96–112)
CO2: 27 mEq/L (ref 19–32)
CREATININE: 0.74 mg/dL (ref 0.40–1.20)
GFR: 84.58 mL/min (ref >60.00)
Glucose, Bld: 86 mg/dL (ref 70–99)
Potassium: 4.9 mEq/L (ref 3.5–5.1)
Sodium: 138 mEq/L (ref 135–145)
TOTAL PROTEIN: 7.5 g/dL (ref 6.0–8.3)
Total Bilirubin: 0.4 mg/dL (ref 0.2–1.2)

## 2016-11-14 LAB — CBC
HEMATOCRIT: 40.4 % (ref 36.0–46.0)
Hemoglobin: 13.8 g/dL (ref 12.0–15.0)
MCHC: 34.2 g/dL (ref 30.0–36.0)
MCV: 100.5 fl — AB (ref 78.0–100.0)
PLATELETS: 329 10*3/uL (ref 150.0–400.0)
RBC: 4.02 Mil/uL (ref 3.87–5.11)
RDW: 13 % (ref 11.5–15.5)
WBC: 6.8 10*3/uL (ref 4.0–10.5)

## 2016-11-14 LAB — LIPID PANEL
CHOL/HDL RATIO: 4
Cholesterol: 232 mg/dL — ABNORMAL HIGH (ref 0–200)
HDL: 64.3 mg/dL (ref >39.00)
LDL CALC: 143 mg/dL — AB (ref 0–99)
NONHDL: 167.68
Triglycerides: 123 mg/dL (ref 0.0–149.0)
VLDL: 24.6 mg/dL (ref 0.0–40.0)

## 2016-11-14 LAB — TSH: TSH: 1.87 u[IU]/mL (ref 0.35–4.50)

## 2016-11-14 LAB — VITAMIN B12: VITAMIN B 12: 481 pg/mL (ref 211–911)

## 2016-11-16 ENCOUNTER — Other Ambulatory Visit (HOSPITAL_BASED_OUTPATIENT_CLINIC_OR_DEPARTMENT_OTHER): Payer: Medicare HMO

## 2016-11-19 DIAGNOSIS — Z0000 Encounter for general adult medical examination without abnormal findings: Secondary | ICD-10-CM | POA: Insufficient documentation

## 2016-11-19 NOTE — Assessment & Plan Note (Signed)
ghm utd Check labs See AVS 

## 2016-11-29 ENCOUNTER — Ambulatory Visit (HOSPITAL_BASED_OUTPATIENT_CLINIC_OR_DEPARTMENT_OTHER)
Admission: RE | Admit: 2016-11-29 | Discharge: 2016-11-29 | Disposition: A | Discharge status: Home / Self Care | Payer: Medicare HMO | Source: Ambulatory Visit | Attending: Family Medicine | Admitting: Family Medicine

## 2016-11-29 DIAGNOSIS — M85851 Other specified disorders of bone density and structure, right thigh: Secondary | ICD-10-CM | POA: Insufficient documentation

## 2016-11-29 DIAGNOSIS — M8588 Other specified disorders of bone density and structure, other site: Secondary | ICD-10-CM | POA: Diagnosis not present

## 2016-11-29 DIAGNOSIS — R42 Dizziness and giddiness: Secondary | ICD-10-CM | POA: Insufficient documentation

## 2016-11-29 DIAGNOSIS — I083 Combined rheumatic disorders of mitral, aortic and tricuspid valves: Secondary | ICD-10-CM | POA: Insufficient documentation

## 2016-11-29 DIAGNOSIS — Z780 Asymptomatic menopausal state: Secondary | ICD-10-CM | POA: Diagnosis not present

## 2016-11-29 NOTE — Progress Notes (Signed)
  Echocardiogram 2D Echocardiogram has been performed.  Bobbye Charleston 11/29/2016, 10:06 AM

## 2016-12-04 ENCOUNTER — Telehealth: Payer: Self-pay | Admitting: Family Medicine

## 2016-12-04 MED ORDER — TRAZODONE HCL 50 MG PO TABS: ORAL_TABLET | ORAL | 0 refills | Status: DC

## 2016-12-04 NOTE — Telephone Encounter (Signed)
Prescription sent in Patient aware. Patient will try that manuver at home.

## 2016-12-04 NOTE — Telephone Encounter (Signed)
ALSO----- Sleep problems.   She was to get something written down for "Cognitive Behavior therapy for insomnia"". She is taking 1/2 ambien daily----wants to try trazodone -- she said you  Stated less addictive. Wants to go and go to trazodone---CAN SHE TAKE 1/2 ambien one night and one trazodone the other night???

## 2016-12-04 NOTE — Telephone Encounter (Signed)
Patient is still dizzy and nauseated----is better, but she thinks it is related to inner ear.  She fell again since seeing you, better since falling.  She is convinced its her ear??  SHE is a lot Better, but a little something not just right.

## 2016-12-04 NOTE — Telephone Encounter (Signed)
trazadone 50mg   1/2 - 1 po qhs prn #30    Not surewhat she means by something written down. Does she mean the list of counselors?   If she is still dizzy and she has tried the [employer] at home ----refer to cardiology.

## 2016-12-18 ENCOUNTER — Telehealth: Payer: Self-pay | Admitting: Family Medicine

## 2016-12-18 MED ORDER — ZOLPIDEM TARTRATE 5 MG PO TABS: 5.0000 mg | ORAL_TABLET | Freq: Every evening | ORAL | 0 refills | Status: DC | PRN

## 2016-12-18 NOTE — Telephone Encounter (Signed)
Updated medication list and sent in 5 mg. Patient informed

## 2016-12-18 NOTE — Telephone Encounter (Signed)
Caller name:Grethel Raford Pitcher Relationship to patient: Can be reached:[phone] Pharmacy:[facility]  Reason for call:Requesting refill on zolpidem 10mg , unable to take trazodone

## 2016-12-18 NOTE — Telephone Encounter (Signed)
Can no longer give 10 mg he her age group-- we can give 5 mg  ambien 5 mg #30

## 2017-01-18 ENCOUNTER — Other Ambulatory Visit: Payer: Self-pay | Admitting: Family Medicine

## 2017-01-18 NOTE — Telephone Encounter (Signed)
Caller name: Relationship to patient: Self Can be reached: [phone]  Pharmacy:  [facility], [place] [phone] (Phone) [phone] (Fax)     Reason for call: Refill zolpidem (AMBIEN) 5 MG tablet

## 2017-01-19 MED ORDER — ZOLPIDEM TARTRATE 5 MG PO TABS: 5.0000 mg | ORAL_TABLET | Freq: Every evening | ORAL | 0 refills | Status: DC | PRN

## 2017-01-19 NOTE — Telephone Encounter (Signed)
database 

## 2017-01-19 NOTE — Telephone Encounter (Signed)
Refill x1 

## 2017-01-19 NOTE — Telephone Encounter (Signed)
Database on desk

## 2017-01-19 NOTE — Telephone Encounter (Signed)
Requesting:   zolpidem Contract   none UDS   none Last OV    11/13/16 Last Refill    #30 no refills on 12/18/16  Please Advise

## 2017-01-19 NOTE — Telephone Encounter (Signed)
Patient notified

## 2017-01-19 NOTE — Telephone Encounter (Signed)
Patient calling back checking on the status of message below, informed patient when requesting any controlled substance please call 48 hours in advance, patient voice understanding stating she's completely out requesting Rx please send to [facility] [id number], [place] [address] [phone]   Due to [facility] buying [facility]

## 2017-02-12 ENCOUNTER — Ambulatory Visit (INDEPENDENT_AMBULATORY_CARE_PROVIDER_SITE_OTHER): Payer: Medicare HMO | Admitting: Family Medicine

## 2017-02-12 ENCOUNTER — Encounter: Payer: Self-pay | Admitting: Family Medicine

## 2017-02-12 VITALS — BP 120/70 | HR 57 | Temp 98.2°F | Resp 16 | Ht 60.0 in | Wt 157.0 lb

## 2017-02-12 DIAGNOSIS — Z0000 Encounter for general adult medical examination without abnormal findings: Secondary | ICD-10-CM

## 2017-02-12 DIAGNOSIS — B351 Tinea unguium: Secondary | ICD-10-CM | POA: Diagnosis not present

## 2017-02-12 DIAGNOSIS — R42 Dizziness and giddiness: Secondary | ICD-10-CM

## 2017-02-12 DIAGNOSIS — H01001 Unspecified blepharitis right upper eyelid: Secondary | ICD-10-CM

## 2017-02-12 DIAGNOSIS — H01004 Unspecified blepharitis left upper eyelid: Secondary | ICD-10-CM

## 2017-02-12 DIAGNOSIS — G4700 Insomnia, unspecified: Secondary | ICD-10-CM | POA: Diagnosis not present

## 2017-02-12 DIAGNOSIS — L600 Ingrowing nail: Secondary | ICD-10-CM

## 2017-02-12 DIAGNOSIS — Z0001 Encounter for general adult medical examination with abnormal findings: Secondary | ICD-10-CM | POA: Diagnosis not present

## 2017-02-12 MED ORDER — ERYTHROMYCIN 5 MG/GM OP OINT: TOPICAL_OINTMENT | Freq: Three times a day (TID) | OPHTHALMIC | Status: AC

## 2017-02-12 MED ORDER — SUVOREXANT 10 MG PO TABS: 1.0000 | ORAL_TABLET | Freq: Every evening | ORAL | 2 refills | Status: DC | PRN

## 2017-02-12 NOTE — Assessment & Plan Note (Signed)
ghm utd Check labs See avs D/w pt shingrix

## 2017-02-12 NOTE — Patient Instructions (Signed)
Preventive Care 40-64 Years, Female Preventive care refers to lifestyle choices and visits with your health care provider that can promote health and wellness. What does preventive care include?  A yearly physical exam. This is also called an annual well check.  Dental exams once or twice a year.  Routine eye exams. Ask your health care provider how often you should have your eyes checked.  Personal lifestyle choices, including: ? Daily care of your teeth and gums. ? Regular physical activity. ? Eating a healthy diet. ? Avoiding tobacco and drug use. ? Limiting alcohol use. ? Practicing safe sex. ? Taking low-dose aspirin daily starting at age 58. ? Taking vitamin and mineral supplements as recommended by your health care provider. What happens during an annual well check? The services and screenings done by your health care provider during your annual well check will depend on your age, overall health, lifestyle risk factors, and family history of disease. Counseling Your health care provider may ask you questions about your:  Alcohol use.  Tobacco use.  Drug use.  Emotional well-being.  Home and relationship well-being.  Sexual activity.  Eating habits.  Work and work Statistician.  Method of birth control.  Menstrual cycle.  Pregnancy history.  Screening You may have the following tests or measurements:  Height, weight, and BMI.  Blood pressure.  Lipid and cholesterol levels. These may be checked every 5 years, or more frequently if you are over 81 years old.  Skin check.  Lung cancer screening. You may have this screening every year starting at age 78 if you have a 30-pack-year history of smoking and currently smoke or have quit within the past 15 years.  Fecal occult blood test (FOBT) of the stool. You may have this test every year starting at age 65.  Flexible sigmoidoscopy or colonoscopy. You may have a sigmoidoscopy every 5 years or a colonoscopy  every 10 years starting at age 30.  Hepatitis C blood test.  Hepatitis B blood test.  Sexually transmitted disease (STD) testing.  Diabetes screening. This is done by checking your blood sugar (glucose) after you have not eaten for a while (fasting). You may have this done every 1-3 years.  Mammogram. This may be done every 1-2 years. Talk to your health care provider about when you should start having regular mammograms. This may depend on whether you have a family history of breast cancer.  BRCA-related cancer screening. This may be done if you have a family history of breast, ovarian, tubal, or peritoneal cancers.  Pelvic exam and Pap test. This may be done every 3 years starting at age 80. Starting at age 36, this may be done every 5 years if you have a Pap test in combination with an HPV test.  Bone density scan. This is done to screen for osteoporosis. You may have this scan if you are at high risk for osteoporosis.  Discuss your test results, treatment options, and if necessary, the need for more tests with your health care provider. Vaccines Your health care provider may recommend certain vaccines, such as:  Influenza vaccine. This is recommended every year.  Tetanus, diphtheria, and acellular pertussis (Tdap, Td) vaccine. You may need a Td booster every 10 years.  Varicella vaccine. You may need this if you have not been vaccinated.  Zoster vaccine. You may need this after age 5.  Measles, mumps, and rubella (MMR) vaccine. You may need at least one dose of MMR if you were born in  1957 or later. You may also need a second dose.  Pneumococcal 13-valent conjugate (PCV13) vaccine. You may need this if you have certain conditions and were not previously vaccinated.  Pneumococcal polysaccharide (PPSV23) vaccine. You may need one or two doses if you smoke cigarettes or if you have certain conditions.  Meningococcal vaccine. You may need this if you have certain  conditions.  Hepatitis A vaccine. You may need this if you have certain conditions or if you travel or work in places where you may be exposed to hepatitis A.  Hepatitis B vaccine. You may need this if you have certain conditions or if you travel or work in places where you may be exposed to hepatitis B.  Haemophilus influenzae type b (Hib) vaccine. You may need this if you have certain conditions.  Talk to your health care provider about which screenings and vaccines you need and how often you need them. This information is not intended to replace advice given to you by your health care provider. Make sure you discuss any questions you have with your health care provider. Document Released: 08/20/2015 Document Revised: 04/12/2016 Document Reviewed: 05/25/2015 Elsevier Interactive Patient Education  2017 [employer].

## 2017-02-12 NOTE — Progress Notes (Signed)
Subjective:   I acted as a Education administrator for Dr. Carollee Herter.  Tanya Griffith, CMA   Tanya Griffith is a 61 y.o. female and is here for a comprehensive physical exam. The patient reports scaling eyelids --- for about 3 weeks  She also c/o con't dizziness  She also c/o "ugly toenails"  They are thick/ yellow.  She has nail polish on now.      Social History   Social History  . Marital status: Married    Spouse name: N/A  . Number of children: N/A  . Years of education: N/A   Occupational History  . unemployed    Social History Main Topics  . Smoking status: Never Smoker  . Smokeless tobacco: Never Used  . Alcohol use No  . Drug use: No  . Sexual activity: Yes   Other Topics Concern  . Not on file   Social History Narrative   Exercise-walks      Health Maintenance  Topic Date Due  . Samul Dada  02/02/1974  . INFLUENZA VACCINE  03/07/2017  . MAMMOGRAM  05/23/2017  . DEXA SCAN  11/30/2018  . Fecal DNA (Cologuard)  09/19/2019  . Hepatitis C Screening  Completed  . HIV Screening  Completed    The following portions of the patient's history were reviewed and updated as appropriate:  She  has a past medical history of Abdominal wall ulcer ([place]); Back problem; Chicken pox; Environmental allergies; IC (interstitial cystitis); Interstitial cystitis; Mitral valve prolapse; and Osteoarthritis (arthritis due to wear and tear of joints). She  does not have any pertinent problems on file. She  has a past surgical history that includes Abdominal hysterectomy; Tonsillectomy; Bladder surgery; Oophorectomy; Knee surgery (Right); Bladder surgery; Nasal septum surgery; and Cesarean section. Her family history includes Alzheimer's disease in her mother; Arthritis in her brother, maternal aunt, maternal grandfather, and mother; Cancer in her sister; Dementia in her mother; Diabetes in her brother and maternal aunt; Heart attack in her paternal grandfather; Hyperlipidemia in her mother; Hypertension  in her brother; Kidney disease (age of onset: 50) in her mother; Transient ischemic attack in her maternal aunt and mother. She  reports that she has never smoked. She has never used smokeless tobacco. She reports that she does not drink alcohol or use drugs. She has a current medication list which includes the following prescription(s): aspirin, biotin, calcium-magnesium, flax seed oil, fluticasone, meclizine, msm, multiple vitamins-minerals, fish oil, probiotic product, zolpidem, and suvorexant, and the following Facility-Administered Medications: erythromycin. Current Outpatient Prescriptions on File Prior to Visit  Medication Sig Dispense Refill  . BIOTIN PO Take by mouth.    . Calcium-Magnesium (CAL-MAG PO) Take 3 tablets by mouth.     . Flaxseed, Linseed, (FLAX SEED OIL) 1000 MG CAPS Take by mouth.    . fluticasone (FLONASE) 50 MCG/ACT nasal spray Place 2 sprays into both nostrils daily. 16 g 6  . meclizine (ANTIVERT) 25 MG tablet Take 1 tablet (25 mg total) by mouth 3 (three) times daily as needed for dizziness. 30 tablet 0  . Methylsulfonylmethane (MSM) 1000 MG TABS Take by mouth.    . Multiple Vitamins-Minerals (CENTRUM SILVER PO) Take by mouth.    . Omega-3 Fatty Acids (FISH OIL) 1000 MG CAPS Take by mouth.    . Probiotic Product (PROBIOTIC ADVANCED PO) Take by mouth.    . zolpidem (AMBIEN) 5 MG tablet Take 1 tablet (5 mg total) by mouth at bedtime as needed for sleep. 30 tablet 0   No  current facility-administered medications on file prior to visit.    She is allergic to other and sulfa antibiotics..  Review of Systems Review of Systems  Constitutional: Negative for activity change, appetite change and fatigue.  HENT: Negative for hearing loss, congestion, tinnitus and ear discharge.  dentist q29m Eyes: Negative for visual disturbance (see optho q1y -- vision corrected to 20/20 with glasses).  Respiratory: Negative for cough, chest tightness and shortness of breath.    Cardiovascular: Negative for chest pain, palpitations and leg swelling.  Gastrointestinal: Negative for abdominal pain, diarrhea, constipation and abdominal distention.  Genitourinary: Negative for urgency, frequency, decreased urine volume and difficulty urinating.  Musculoskeletal: Negative for back pain, arthralgias and gait problem.  Skin: Negative for color change, pallor and rash.  Neurological: Negative for dizziness, light-headedness, numbness and headaches.  Hematological: Negative for adenopathy. Does not bruise/bleed easily.  Psychiatric/Behavioral: Negative for suicidal ideas, confusion, sleep disturbance, self-injury, dysphoric mood, decreased concentration and agitation.       Objective:    BP 120/70 (BP Location: Left Arm, Cuff Size: Normal)   Pulse (!) 57   Temp 98.2 F (36.8 C) (Oral)   Resp 16   Ht 5' (1.524 m)   Wt 157 lb (71.2 kg)   SpO2 98%   BMI 30.66 kg/m  General appearance: alert, cooperative and no distress Head: Normocephalic, without obvious abnormality, atraumatic Eyes: positive findings: eyelids/periorbital: blepharitis bilaterally Ears: normal TM's and external ear canals both ears Nose: Nares normal. Septum midline. Mucosa normal. No drainage or sinus tenderness. Throat: lips, mucosa, and tongue normal; teeth and gums normal Neck: no adenopathy, no carotid bruit, no JVD, supple, symmetrical, trachea midline and thyroid not enlarged, symmetric, no tenderness/mass/nodules Back: symmetric, no curvature. ROM normal. No CVA tenderness. Lungs: clear to auscultation bilaterally Breasts: normal appearance, no masses or tenderness Heart: regular rate and rhythm, S1, S2 normal, no murmur, click, rub or gallop Abdomen: soft, non-tender; bowel sounds normal; no masses,  no organomegaly Pelvic: not indicated; post-menopausal, no abnormal Pap smears in past Extremities: extremities normal, atraumatic, no cyanosis or edema Pulses: 2+ and symmetric Skin: Skin  color, texture, turgor normal. No rashes or lesions Lymph nodes: Cervical, supraclavicular, and axillary nodes normal. Neurologic: Alert and oriented X 3, normal strength and tone. Normal symmetric reflexes. Normal coordination and gait   nails-- thickened nails, difficult to assess due to nail polish  Ingrown toenail R toe  Assessment:    Healthy female exam.      Plan:    ghm utd Check labs See After Visit Summary for Counseling Recommendations    1. Ingrown toenail   - Ambulatory referral to Podiatry  2. Onychomycosis   - Ambulatory referral to Podiatry  3. Blepharitis of upper eyelids of both eyes, unspecified type   - erythromycin ophthalmic ointment; Place into both eyes every 8 (eight) hours.  4. Dizziness con't problem-- refer to ent for audiology eval / ent   - Ambulatory referral to ENT

## 2017-02-13 ENCOUNTER — Telehealth: Payer: Self-pay | Admitting: Family Medicine

## 2017-02-13 MED ORDER — ZOLPIDEM TARTRATE 5 MG PO TABS: 5.0000 mg | ORAL_TABLET | Freq: Every evening | ORAL | 1 refills | Status: DC | PRN

## 2017-02-13 MED ORDER — ERYTHROMYCIN 5 MG/GM OP OINT: 1.0000 "application " | TOPICAL_OINTMENT | Freq: Every day | OPHTHALMIC | 1 refills | Status: DC

## 2017-02-13 NOTE — Telephone Encounter (Signed)
D/c belsomra Refill ambien x1 Ok to resend erythromycin

## 2017-02-13 NOTE — Telephone Encounter (Signed)
Advise on change please

## 2017-02-13 NOTE — Telephone Encounter (Signed)
Faxed hardcopy for [facility] to [facility] [place] Notified the patient of request taken care of and eye drops/ointment sent in.

## 2017-02-13 NOTE — Telephone Encounter (Signed)
Relation to DA:PTCK Call back [phone] Pharmacy: [facility], [place], [place] [address] [phone]    Reason for call:  Patient states Suvorexant (BELSOMRA) 10 MG TABS cost $400, requesting alternate zolpidem (AMBIEN) 5 MG tablet. Patient requesring erythromycin ophthalmic ointment please send to [facility], [place], [place] [address] [phone]. Patient states please remove all pharmacy listed in her chart.

## 2017-03-06 ENCOUNTER — Ambulatory Visit (INDEPENDENT_AMBULATORY_CARE_PROVIDER_SITE_OTHER): Payer: Medicare PPO | Admitting: Podiatry

## 2017-03-06 DIAGNOSIS — L603 Nail dystrophy: Secondary | ICD-10-CM | POA: Diagnosis not present

## 2017-03-06 DIAGNOSIS — M2010 Hallux valgus (acquired), unspecified foot: Secondary | ICD-10-CM | POA: Diagnosis not present

## 2017-03-06 DIAGNOSIS — M2042 Other hammer toe(s) (acquired), left foot: Secondary | ICD-10-CM | POA: Diagnosis not present

## 2017-03-06 DIAGNOSIS — M2041 Other hammer toe(s) (acquired), right foot: Secondary | ICD-10-CM

## 2017-03-06 DIAGNOSIS — L600 Ingrowing nail: Secondary | ICD-10-CM

## 2017-03-06 DIAGNOSIS — L609 Nail disorder, unspecified: Secondary | ICD-10-CM | POA: Diagnosis not present

## 2017-03-06 DIAGNOSIS — B351 Tinea unguium: Secondary | ICD-10-CM | POA: Diagnosis not present

## 2017-03-06 NOTE — Progress Notes (Signed)
Subjective:    Patient ID: Tanya Griffith, female   DOB: 61 y.o.   MRN: [id number]   HPI Tanya Griffith presents to the office For concerns of toenail issues. She states that over the last several months she has noticed that her nails are becoming discolored as well as growing different shapes. She loses is from pressure due to her toes. She does have the area checked before the tendon get worse. She states that all of her nails get uncomfortable at times but denies any specific pain she denies any redness or drainage or any swelling. She had a recent treatment. No other concerns.    Review of Systems  All other systems reviewed and are negative.       Objective:  Physical Exam General: AAO x3, NAD  Dermatological: Nails reveals dystrophic, discolored with yellow discoloration mildly hypertrophic and dystrophic. There is incurvation on several lives toenails. There is no specific pain Geneva tenderness there is no redness or drainage or any swelling on toenail sites. There is no open lesions identified.  Vascular: Dorsalis Pedis artery and Posterior Tibial artery pedal pulses are 2/4 bilateral with immedate capillary fill time.  There is no pain with calf compression, swelling, warmth, erythema.   Neruologic: Grossly intact via light touch bilateral. Protective threshold with Semmes Wienstein monofilament intact to all pedal sites bilateral.   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 Hammertoes are present. Muscular strength 5/5 in all groups tested bilateral.  Gait: Unassisted, Nonantalgic.      Assessment:     Onychodystrophy     Plan:     -Treatment options discussed including all alternatives, risks, and complications -Etiology of symptoms were discussed -The nails are debrided today and they were sent to  [facility] for pathology/culture. Discussed treatment options but we will await the results of the culture/biopsy before proceeding with treatment.  -Discussed shoe gear  modifications and offloading to help take pressure off the nails from her hammertoes.  -Follow-up after biopsy results or sooner if needed.   Celesta Gentile, DPM

## 2017-03-08 NOTE — Addendum Note (Signed)
Addended by: Cranford Mon R on: 03/08/2017 07:51 AM   Modules accepted: Orders

## 2017-03-12 ENCOUNTER — Telehealth: Payer: Self-pay | Admitting: Family Medicine

## 2017-03-12 NOTE — Telephone Encounter (Signed)
Relation to EK:IYJG Call back number:[phone]   Reason for call:  Patient would like to know how frequently she should have her Cologuard done,please advise or lvm

## 2017-03-13 NOTE — Telephone Encounter (Signed)
Informed the patient recommendation every 3 years.

## 2017-03-14 ENCOUNTER — Telehealth: Payer: Self-pay | Admitting: Podiatry

## 2017-03-14 ENCOUNTER — Telehealth: Payer: Self-pay | Admitting: *Deleted

## 2017-03-14 NOTE — Telephone Encounter (Signed)
I saw Dr. Jacqualyn Posey at the [facility] office and I was told by the nurse that she was going to mail me some toe crests. I never received anything until today. I got a big envelope and in the envelope was a smaller envelope from your office but the back was missing and nothing was in there. It looks like the postal service destroyed it and I think the toe crest's were in there. I was calling to see if I could get those replaced and/or what should I do or how do I go about getting that done.

## 2017-03-14 NOTE — Telephone Encounter (Signed)
Patient called and stated that the package that I sent to her was opened and the toe crest pads were not in the envelope and I stated that I would sent another set and it would be in a big brown envelope and was sending out today and to call [place] office [phone]

## 2017-03-15 NOTE — Telephone Encounter (Signed)
Called patient and I have taken care of this. Tanya Griffith

## 2017-03-16 ENCOUNTER — Telehealth: Payer: Self-pay | Admitting: *Deleted

## 2017-03-16 NOTE — Telephone Encounter (Signed)
-----   Message from Trula Slade, DPM sent at 03/16/2017  6:30 AM EDT ----- Culture does not show fungus. However clinically, it does. I would do a topial urea 40% on the nails and still the onychomycosis ointment from [facility] if you could pease prescribe.

## 2017-06-05 ENCOUNTER — Other Ambulatory Visit: Payer: Self-pay | Admitting: Family Medicine

## 2017-06-05 DIAGNOSIS — Z1231 Encounter for screening mammogram for malignant neoplasm of breast: Secondary | ICD-10-CM

## 2017-06-18 ENCOUNTER — Ambulatory Visit (HOSPITAL_BASED_OUTPATIENT_CLINIC_OR_DEPARTMENT_OTHER)
Admission: RE | Admit: 2017-06-18 | Discharge: 2017-06-18 | Disposition: A | Discharge status: Home / Self Care | Payer: Medicare HMO | Source: Ambulatory Visit | Attending: Family Medicine | Admitting: Family Medicine

## 2017-06-18 ENCOUNTER — Encounter (HOSPITAL_BASED_OUTPATIENT_CLINIC_OR_DEPARTMENT_OTHER): Payer: Self-pay

## 2017-06-18 DIAGNOSIS — Z1231 Encounter for screening mammogram for malignant neoplasm of breast: Secondary | ICD-10-CM | POA: Diagnosis not present

## 2018-02-18 ENCOUNTER — Telehealth: Payer: Self-pay | Admitting: Family Medicine

## 2018-02-18 NOTE — Telephone Encounter (Signed)
If the form requires phys exam she will need to come in

## 2018-02-18 NOTE — Telephone Encounter (Signed)
Routed to Dr. Etter Sjogren and CMA to advise re: needing form signed to approve travel reimbursement in light of vertigo episode.

## 2018-02-18 NOTE — Telephone Encounter (Signed)
Patient dropped off form. Patient has a cpe scheduled early October. Document in tray up front.

## 2018-02-18 NOTE — Telephone Encounter (Signed)
Copied from [place] [phone]. Topic: General - Other >> Feb 18, 2018 10:19 AM Nils Flack wrote: Reason for CRM: pt missed a flight on Saturday due to a vertigo spell.  She says that she woke up with severe vertigo and was vomiting and could not make her flight.  She has travel insurance and needs to have a form filled out so that she can be reimbursed for the expenses.  Please call pt back at [phone] to discuss if this can be done without an appt.

## 2018-02-25 NOTE — Telephone Encounter (Signed)
Paperwork requesting completion for travel reimbursement regarding dizziness. Pt scheduled a CPE  For 05/09/18. Patient last seen 02/12/17 >12 months and was referred to ENT. Referral note states that "[facility] ENT called patient [3] times and mailed patient a letter. Patient has not called to scheduled". There are no EMR records that patient went to the ENT referral. Will forward to provider with notes [and explanation]. I cannot fill out with the information that I have/SLS 07/22

## 2018-02-26 NOTE — Telephone Encounter (Signed)
Patient has not been seen in > 12 months; pt has no record of attending appointment with ENT referred by PCP 02/217; forwarded to provider with notes on these items/SLS 07/23

## 2018-03-04 NOTE — Telephone Encounter (Signed)
Per Dr. Etter Sjogren, patient must have office visit [has not been seen >12 months] before she will be able to complete any paperwork she has submitted. Please contact patient and schedule appointment with PCP. Thanks/SLS 07/29

## 2018-03-05 NOTE — Telephone Encounter (Signed)
Spoke with pt and schedule her appt for August 1,2019 at 2:00 to fill out paperwork (30 min appt). Done.

## 2018-03-07 ENCOUNTER — Ambulatory Visit (INDEPENDENT_AMBULATORY_CARE_PROVIDER_SITE_OTHER): Payer: Medicare HMO | Admitting: Family Medicine

## 2018-03-07 ENCOUNTER — Encounter: Payer: Self-pay | Admitting: Family Medicine

## 2018-03-07 DIAGNOSIS — R42 Dizziness and giddiness: Secondary | ICD-10-CM | POA: Insufficient documentation

## 2018-03-07 NOTE — Patient Instructions (Signed)

## 2018-03-07 NOTE — Progress Notes (Signed)
Patient ID: Tanya Griffith, female    DOB: November 23, 1955  Age: 62 y.o. MRN: [id number]    Subjective:  Subjective  HPI Tanya Griffith presents for f/u vertigo episode --7/13-- she was supposed to get on an airplane to go to [place] because they were going to support a bill that had her dads name on it.  He died in car crash 35 years ago.  The vertigo came on and then she started vomiting.  She was unable to fly and the antivert took a while to work.  They had to cancel the flight  Review of Systems  Constitutional: Negative for appetite change, diaphoresis, fatigue, fever and unexpected weight change.  HENT: Negative for congestion.   Eyes: Negative for pain, redness and visual disturbance.  Respiratory: Negative for cough, chest tightness, shortness of breath and wheezing.   Cardiovascular: Negative for chest pain, palpitations and leg swelling.  Gastrointestinal: Negative for abdominal pain, blood in stool and nausea.  Endocrine: Negative for cold intolerance, heat intolerance, polydipsia, polyphagia and polyuria.  Genitourinary: Negative for difficulty urinating, dysuria and frequency.  Skin: Negative for rash.  Allergic/Immunologic: Negative for environmental allergies.  Neurological: Negative for dizziness, light-headedness, numbness and headaches.  Psychiatric/Behavioral: The patient is not nervous/anxious.     History Past Medical History:  Diagnosis Date  . Abdominal wall ulcer ([place])   . Back problem   . Chicken pox   . Environmental allergies   . IC (interstitial cystitis)   . Interstitial cystitis   . Mitral valve prolapse   . Osteoarthritis (arthritis due to wear and tear of joints)     She has a past surgical history that includes Abdominal hysterectomy; Tonsillectomy; Bladder surgery; Oophorectomy; Knee surgery (Right); Bladder surgery; Nasal septum surgery; and Cesarean section.   Her family history includes Alzheimer's disease in her mother; Arthritis in her  brother, maternal aunt, maternal grandfather, and mother; Breast cancer in her unknown relative; Cancer in her sister; Dementia in her mother; Diabetes in her brother and maternal aunt; Heart attack in her paternal grandfather; Hyperlipidemia in her mother; Hypertension in her brother; Kidney disease (age of onset: 12) in her mother; Transient ischemic attack in her maternal aunt and mother.She reports that she has never smoked. She has never used smokeless tobacco. She reports that she does not drink alcohol or use drugs.  Current Outpatient Medications on File Prior to Visit  Medication Sig Dispense Refill  . aspirin 325 MG tablet Take 325 mg by mouth daily.    Marland Kitchen BIOTIN PO Take by mouth.    . Calcium-Magnesium (CAL-MAG PO) Take 3 tablets by mouth.     . erythromycin ophthalmic ointment Place 1 application into both eyes at bedtime. 3.5 g 1  . Flaxseed, Linseed, (FLAX SEED OIL) 1000 MG CAPS Take by mouth.    . fluticasone (FLONASE) 50 MCG/ACT nasal spray Place 2 sprays into both nostrils daily. 16 g 6  . L-Lysine 500 MG TABS Take by mouth.    . meclizine (ANTIVERT) 25 MG tablet Take 1 tablet (25 mg total) by mouth 3 (three) times daily as needed for dizziness. 30 tablet 0  . MELATONIN PO Take by mouth at bedtime.    . Methylsulfonylmethane (MSM) 1000 MG TABS Take by mouth.    . Multiple Vitamins-Minerals (CENTRUM SILVER PO) Take by mouth.    Salley Scarlet FORMULARY [facility]  Onychomycosis Nail Lacquer -  Fluconazole 2%, Terbinafine 1% DMSO Apply to affected nail once daily Qty. 120 gm  3 refills    . Omega-3 Fatty Acids (FISH OIL) 1000 MG CAPS Take by mouth.    . Probiotic Product (PROBIOTIC ADVANCED PO) Take by mouth.     No current facility-administered medications on file prior to visit.      Objective:  Objective  Physical Exam  Constitutional: She is oriented to person, place, and time. She appears well-developed and well-nourished.  HENT:  Head: Normocephalic and atraumatic.    Eyes: Conjunctivae and EOM are normal.  Neck: Normal range of motion. Neck supple. No JVD present. Carotid bruit is not present. No thyromegaly present.  Cardiovascular: Normal rate, regular rhythm and normal heart sounds.  No murmur heard. Pulmonary/Chest: Effort normal and breath sounds normal. No respiratory distress. She has no wheezes. She has no rales. She exhibits no tenderness.  Musculoskeletal: She exhibits no edema.  Neurological: She is alert and oriented to person, place, and time.  Psychiatric: She has a normal mood and affect.  Nursing note and vitals reviewed.  BP 117/67 (BP Location: Left Arm, Patient Position: Sitting, Cuff Size: Normal)   Pulse 63   Temp 98 F (36.7 C) (Oral)   Ht 5' (1.524 m)   Wt 154 lb 3.2 oz (69.9 kg)   SpO2 98%   BMI 30.12 kg/m  Wt Readings from Last 3 Encounters:  03/07/18 154 lb 3.2 oz (69.9 kg)  02/12/17 157 lb (71.2 kg)  11/13/16 158 lb (71.7 kg)     Lab Results  Component Value Date   WBC 6.8 11/13/2016   HGB 13.8 11/13/2016   HCT 40.4 11/13/2016   PLT 329.0 11/13/2016   GLUCOSE 86 11/13/2016   CHOL 232 (H) 11/13/2016   TRIG 123.0 11/13/2016   HDL 64.30 11/13/2016   LDLCALC 143 (H) 11/13/2016   ALT 12 11/13/2016   AST 22 11/13/2016   NA 138 11/13/2016   K 4.9 11/13/2016   CL 104 11/13/2016   CREATININE 0.74 11/13/2016   BUN 17 11/13/2016   CO2 27 11/13/2016   TSH 1.87 11/13/2016   HGBA1C 5.6 06/08/2014    Mm Screening Breast Tomo Bilateral  Result Date: 06/18/2017 CLINICAL DATA:  Screening. EXAM: 2D DIGITAL SCREENING BILATERAL MAMMOGRAM WITH CAD AND ADJUNCT TOMO COMPARISON:  Previous exam(s). ACR Breast Density Category b: There are scattered areas of fibroglandular density. FINDINGS: There are no findings suspicious for malignancy. Images were processed with CAD. IMPRESSION: No mammographic evidence of malignancy. A result letter of this screening mammogram will be mailed directly to the patient. RECOMMENDATION:  Screening mammogram in one year. (Code:SM-B-01Y) BI-RADS CATEGORY  1: Negative. Electronically Signed   By: Lillia Mountain M.D.   On: 06/18/2017 15:30     Assessment & Plan:  Plan  I have discontinued Oscar Forman. Metzer's zolpidem. I am also having her maintain her Fish Oil, MSM, Calcium-Magnesium (CAL-MAG PO), Flax Seed Oil, Multiple Vitamins-Minerals (CENTRUM SILVER PO), Probiotic Product (PROBIOTIC ADVANCED PO), BIOTIN PO, fluticasone, meclizine, aspirin, erythromycin, NON FORMULARY, MELATONIN PO, and L-Lysine.  No orders of the defined types were placed in this encounter.   Problem List Items Addressed This Visit      Unprioritized   Vertigo    symptoms have resolved now Papers filled out for airlines    Follow-up: Return if symptoms worsen or fail to improve.  Ann Held, DO

## 2018-04-12 ENCOUNTER — Telehealth: Payer: Self-pay | Admitting: *Deleted

## 2018-04-12 DIAGNOSIS — Z808 Family history of malignant neoplasm of other organs or systems: Secondary | ICD-10-CM

## 2018-04-12 NOTE — Telephone Encounter (Signed)
Left detailed message that we sent over a referral for her.

## 2018-04-12 NOTE — Telephone Encounter (Signed)
Refer to Dr Renda Rolls in [place]

## 2018-04-12 NOTE — Telephone Encounter (Signed)
Copied from [place] [id number]. Topic: Referral - Request >> Apr 10, 2018 12:49 PM Rutherford Nail, NT wrote: Reason for CRM: Patient would like to get a referral for "a really good female dermatologist." States that she has a family history of skin cancer and just wants to get some places looked at. Please advise. CB#: [phone]

## 2018-04-12 NOTE — Telephone Encounter (Signed)
Any recommendations?

## 2018-05-09 ENCOUNTER — Encounter: Payer: Medicare HMO | Admitting: Family Medicine

## 2018-06-20 NOTE — Progress Notes (Signed)
Subjective:   Tanya Griffith is a 62 y.o. female who presents for Medicare Annual (Subsequent) preventive examination.  Review of Systems: No ROS.  Medicare Wellness Visit. Additional risk factors are reflected in the social history. Cardiac Risk Factors include: advanced age (>62men, >87 women) Sleep patterns: Does not sleep well due to bladder issues. Has discussed with PCP today. Home Safety/Smoke Alarms: Feels safe in home. Smoke alarms in place. Lives with husband and dog in 2 story home.  Female:   Pap- hysterectomy       Mammo- pt states she will schedule    Dexa scan-utd [facility]- Cologuard 09/18/16-neg    Objective:     Vitals: BP (!) 110/55 (BP Location: Left Arm, Patient Position: Sitting, Cuff Size: Normal)   Pulse 73   Ht 5' (1.524 m)   Wt 158 lb (71.7 kg)   SpO2 99%   BMI 30.86 kg/m   Body mass index is 30.86 kg/m.  Advanced Directives 06/21/2018 11/13/2016  Does Patient Have a Medical Advance Directive? Yes No  Type of Paramedic of [place];Living will -  Copy of [place] in Chart? No - copy requested -  Would patient like information on creating a medical advance directive? - Yes (MAU/Ambulatory/Procedural Areas - Information given)    Tobacco Social History   Tobacco Use  Smoking Status Never Smoker  Smokeless Tobacco Never Used     Counseling given: Not Answered   Clinical Intake:  Pain : No/denies pain    Past Medical History:  Diagnosis Date  . Abdominal wall ulcer ([place])   . Back problem   . Chicken pox   . Environmental allergies   . IC (interstitial cystitis)   . Interstitial cystitis   . Mitral valve prolapse   . Osteoarthritis (arthritis due to wear and tear of joints)    Past Surgical History:  Procedure Laterality Date  . ABDOMINAL HYSTERECTOMY    . BLADDER SURGERY    . BLADDER SURGERY    . CESAREAN SECTION    . KNEE SURGERY Right   . MASTECTOMY Bilateral    jan 2018  . NASAL  SEPTUM SURGERY    . OOPHORECTOMY    . TONSILLECTOMY     Family History  Problem Relation Age of Onset  . Dementia Mother        Vascular Dementia  . Alzheimer's disease Mother   . Arthritis Mother   . Hyperlipidemia Mother   . Transient ischemic attack Mother   . Kidney disease Mother 58       Kidney Failure  . Cancer Sister        skin  . Arthritis Brother   . Arthritis Maternal Grandfather   . Arthritis Maternal Aunt   . Heart attack Paternal Grandfather   . Transient ischemic attack Maternal Aunt   . Hypertension Brother   . Diabetes Maternal Aunt   . Diabetes Brother        Medication induced  . Breast cancer Unknown        Niece--Double Mastectomy   Social History   Socioeconomic History  . Marital status: Married    Spouse name: Not on file  . Number of children: Not on file  . Years of education: Not on file  . Highest education level: Not on file  Occupational History  . Occupation: unemployed  Social Needs  . Financial resource strain: Not on file  . Food insecurity:    Worry: Not  on file    Inability: Not on file  . Transportation needs:    Medical: Not on file    Non-medical: Not on file  Tobacco Use  . Smoking status: Never Smoker  . Smokeless tobacco: Never Used  Substance and Sexual Activity  . Alcohol use: No  . Drug use: No  . Sexual activity: Not Currently  Lifestyle  . Physical activity:    Days per week: Not on file    Minutes per session: Not on file  . Stress: Not on file  Relationships  . Social connections:    Talks on phone: Not on file    Gets together: Not on file    Attends religious service: Not on file    Active member of club or organization: Not on file    Attends meetings of clubs or organizations: Not on file    Relationship status: Not on file  Other Topics Concern  . Not on file  Social History Narrative   Exercise-walks    Outpatient Encounter Medications as of 06/21/2018  Medication Sig  . aspirin 325 MG  tablet Take 325 mg by mouth daily.  Marland Kitchen BIOTIN PO Take 10,000 mcg by mouth daily.   . Calcium-Magnesium (CAL-MAG PO) Take 3 tablets by mouth. Ca 1500 and mag 1000mg   . erythromycin ophthalmic ointment Place 1 application into both eyes at bedtime.  . Flaxseed, Linseed, (FLAX SEED OIL) 1000 MG CAPS Take by mouth.  . fluticasone (FLONASE) 50 MCG/ACT nasal spray Place 2 sprays into both nostrils daily.  Marland Kitchen L-Lysine 500 MG TABS Take by mouth.  . meclizine (ANTIVERT) 25 MG tablet Take 1 tablet (25 mg total) by mouth 3 (three) times daily as needed for dizziness.  Marland Kitchen MELATONIN PO Take by mouth at bedtime.  . Methylsulfonylmethane (MSM) 1000 MG TABS Take by mouth. 5 mg With glucosasmine hcl 1500mg   . Multiple Vitamins-Minerals (CENTRUM SILVER PO) Take by mouth.  Salley Scarlet FORMULARY [facility]  Onychomycosis Nail Lacquer -  Fluconazole 2%, Terbinafine 1% DMSO Apply to affected nail once daily Qty. 120 gm 3 refills  . Omega-3 Fatty Acids (FISH OIL) 1000 MG CAPS Take by mouth.  . Probiotic Product (PROBIOTIC ADVANCED PO) Take by mouth.   No facility-administered encounter medications on file as of 06/21/2018.     Activities of Daily Living In your present state of health, do you have any difficulty performing the following activities: 06/21/2018  Hearing? N  Vision? N  Difficulty concentrating or making decisions? N  Walking or climbing stairs? N  Dressing or bathing? N  Doing errands, shopping? N  Preparing Food and eating ? N  Using the Toilet? N  In the past six months, have you accidently leaked urine? N  Do you have problems with loss of bowel control? N  Managing your Medications? N  Managing your Finances? N  Housekeeping or managing your Housekeeping? N  Some recent data might be hidden    Patient Care Team: Carollee Herter, Alferd Apa, DO as PCP - General (Family Medicine) [place], [place] Eduardo Osier., MD as Attending Physician (Urology)    Assessment:   This is a routine wellness  examination for [place]. Physical assessment deferred to PCP.  Exercise Activities and Dietary recommendations Current Exercise Habits: Home exercise routine, Type of exercise: walking, Intensity: Mild, Exercise limited by: None identified   Diet (meal preparation, eat out, water intake, caffeinated beverages, dairy products, fruits and vegetables):  Pt states she is vegetarian. Will eat eggs  and dairy.  Goals    . Lose weight through exercise.    . To be more positive       Fall Risk Fall Risk  06/21/2018 11/13/2016  Falls in the past year? 1 Yes  Number falls in past yr: 0 1  Injury with Fall? 0 Yes  Comment - pt report she tripped over a root sticking up from the ground and hit her right shoulder.   Depression Screen PHQ 2/9 Scores 06/21/2018 11/13/2016  PHQ - 2 Score 0 0     Cognitive Function Ad8 score reviewed for issues:  Issues making decisions:no  Less interest in hobbies / activities:no  Repeats questions, stories (family complaining):no  Trouble using ordinary gadgets (microwave, computer, phone):no  Forgets the month or year: no  Mismanaging finances: no  Remembering appts:no  Daily problems with thinking and/or memory:no Ad8 score is=0     MMSE - Mini Mental State Exam 11/13/2016  Orientation to time 5  Orientation to Place 5  Registration 3  Attention/ Calculation 5  Recall 3  Language- name 2 objects 2  Language- repeat 1  Language- follow 3 step command 3  Language- read & follow direction 1  Write a sentence 1  Copy design 1  Total score 30         There is no immunization history on file for this patient.   Screening Tests Health Maintenance  Topic Date Due  . MAMMOGRAM  06/18/2018  . INFLUENZA VACCINE  03/08/2019 (Originally 03/07/2018)  . TETANUS/TDAP  06/21/2024 (Originally 02/02/1974)  . DEXA SCAN  11/30/2018  . Fecal DNA (Cologuard)  09/19/2019  . Hepatitis C Screening  Completed  . HIV Screening  Completed      Plan:     Please schedule your next medicare wellness visit with me in 1 yr.  Bring a copy of your living will and/or healthcare power of attorney to your next office visit.  Please schedule mammogram  I have personally reviewed and noted the following in the patient's chart:   . Medical and social history . Use of alcohol, tobacco or illicit drugs  . Current medications and supplements . Functional ability and status . Nutritional status . Physical activity . Advanced directives . List of other physicians . Hospitalizations, surgeries, and ER visits in previous 12 months . Vitals . Screenings to include cognitive, depression, and falls . Referrals and appointments  In addition, I have reviewed and discussed with patient certain preventive protocols, quality metrics, and best practice recommendations. A written personalized care plan for preventive services as well as general preventive health recommendations were provided to patient.     Shela Nevin, [place]  06/21/2018

## 2018-06-21 ENCOUNTER — Ambulatory Visit (INDEPENDENT_AMBULATORY_CARE_PROVIDER_SITE_OTHER): Payer: Medicare HMO | Admitting: Family Medicine

## 2018-06-21 ENCOUNTER — Other Ambulatory Visit: Payer: Self-pay | Admitting: Family Medicine

## 2018-06-21 ENCOUNTER — Ambulatory Visit (INDEPENDENT_AMBULATORY_CARE_PROVIDER_SITE_OTHER): Payer: Medicare HMO | Admitting: *Deleted

## 2018-06-21 ENCOUNTER — Encounter

## 2018-06-21 ENCOUNTER — Encounter: Payer: Self-pay | Admitting: *Deleted

## 2018-06-21 ENCOUNTER — Encounter: Payer: Self-pay | Admitting: Family Medicine

## 2018-06-21 VITALS — BP 110/55 | HR 73 | Ht 60.0 in | Wt 158.0 lb

## 2018-06-21 VITALS — BP 110/55 | HR 73 | Temp 98.2°F | Resp 16 | Ht 60.0 in | Wt 158.4 lb

## 2018-06-21 DIAGNOSIS — Z1231 Encounter for screening mammogram for malignant neoplasm of breast: Secondary | ICD-10-CM

## 2018-06-21 DIAGNOSIS — Z0000 Encounter for general adult medical examination without abnormal findings: Secondary | ICD-10-CM | POA: Diagnosis not present

## 2018-06-21 DIAGNOSIS — R5383 Other fatigue: Secondary | ICD-10-CM

## 2018-06-21 DIAGNOSIS — Z136 Encounter for screening for cardiovascular disorders: Secondary | ICD-10-CM | POA: Diagnosis not present

## 2018-06-21 DIAGNOSIS — G4700 Insomnia, unspecified: Secondary | ICD-10-CM | POA: Diagnosis not present

## 2018-06-21 DIAGNOSIS — Z0001 Encounter for general adult medical examination with abnormal findings: Secondary | ICD-10-CM

## 2018-06-21 MED ORDER — TEMAZEPAM 15 MG PO CAPS: 15.0000 mg | ORAL_CAPSULE | Freq: Every evening | ORAL | 2 refills | Status: DC | PRN

## 2018-06-21 NOTE — Progress Notes (Signed)
Reviewed  Yvonne R Lowne Chase, DO  

## 2018-06-21 NOTE — Progress Notes (Signed)
Subjective:     Tanya Griffith is a 62 y.o. female and is here for a comprehensive physical exam. The patient reports problems - mult problems-- she cant sleep -- she has seen neuro .  Social History   Socioeconomic History  . Marital status: Married    Spouse name: Not on file  . Number of children: Not on file  . Years of education: Not on file  . Highest education level: Not on file  Occupational History  . Occupation: unemployed  Social Needs  . Financial resource strain: Not on file  . Food insecurity:    Worry: Not on file    Inability: Not on file  . Transportation needs:    Medical: Not on file    Non-medical: Not on file  Tobacco Use  . Smoking status: Never Smoker  . Smokeless tobacco: Never Used  Substance and Sexual Activity  . Alcohol use: No  . Drug use: No  . Sexual activity: Not Currently  Lifestyle  . Physical activity:    Days per week: Not on file    Minutes per session: Not on file  . Stress: Not on file  Relationships  . Social connections:    Talks on phone: Not on file    Gets together: Not on file    Attends religious service: Not on file    Active member of club or organization: Not on file    Attends meetings of clubs or organizations: Not on file    Relationship status: Not on file  . Intimate partner violence:    Fear of current or ex partner: Not on file    Emotionally abused: Not on file    Physically abused: Not on file    Forced sexual activity: Not on file  Other Topics Concern  . Not on file  Social History Narrative   Exercise-walks   Health Maintenance  Topic Date Due  . MAMMOGRAM  06/18/2018  . INFLUENZA VACCINE  03/08/2019 (Originally 03/07/2018)  . TETANUS/TDAP  06/21/2024 (Originally 02/02/1974)  . DEXA SCAN  11/30/2018  . Fecal DNA (Cologuard)  09/19/2019  . Hepatitis C Screening  Completed  . HIV Screening  Completed    The following portions of the patient's history were reviewed and updated as appropriate: She   has a past medical history of Abdominal wall ulcer (Blue), Back problem, Chicken pox, Environmental allergies, IC (interstitial cystitis), Interstitial cystitis, Mitral valve prolapse, and Osteoarthritis (arthritis due to wear and tear of joints). She does not have any pertinent problems on file. She  has a past surgical history that includes Abdominal hysterectomy; Tonsillectomy; Bladder surgery; Oophorectomy; Knee surgery (Right); Bladder surgery; Nasal septum surgery; Cesarean section; and Mastectomy (Bilateral). Her family history includes Alzheimer's disease in her mother; Arthritis in her brother, maternal aunt, maternal grandfather, and mother; Breast cancer in her unknown relative; Cancer in her sister; Dementia in her mother; Diabetes in her brother and maternal aunt; Heart attack in her paternal grandfather; Hyperlipidemia in her mother; Hypertension in her brother; Kidney disease (age of onset: 12) in her mother; Transient ischemic attack in her maternal aunt and mother. She  reports that she has never smoked. She has never used smokeless tobacco. She reports that she does not drink alcohol or use drugs. She has a current medication list which includes the following prescription(s): aspirin, b complex-c-folic acid, biotin, calcium-magnesium, erythromycin, flax seed oil, fluticasone, l-lysine, meclizine, melatonin, msm, multiple vitamins-minerals, NON FORMULARY, fish oil, probiotic product, and temazepam.  Current Outpatient Medications on File Prior to Visit  Medication Sig Dispense Refill  . aspirin 325 MG tablet Take 325 mg by mouth daily.    . B Complex-C-Folic Acid (STRESS B COMPLEX PO) Take by mouth.    Marland Kitchen BIOTIN PO Take 10,000 mcg by mouth daily.     . Calcium-Magnesium (CAL-MAG PO) Take 3 tablets by mouth. Ca 1500 and mag 1000mg     . erythromycin ophthalmic ointment Place 1 application into both eyes at bedtime. 3.5 g 1  . Flaxseed, Linseed, (FLAX SEED OIL) 1000 MG CAPS Take by mouth.     . fluticasone (FLONASE) 50 MCG/ACT nasal spray Place 2 sprays into both nostrils daily. 16 g 6  . L-Lysine 500 MG TABS Take by mouth.    . meclizine (ANTIVERT) 25 MG tablet Take 1 tablet (25 mg total) by mouth 3 (three) times daily as needed for dizziness. 30 tablet 0  . MELATONIN PO Take by mouth at bedtime.    . Methylsulfonylmethane (MSM) 1000 MG TABS Take by mouth. 5 mg With glucosasmine hcl 1500mg     . Multiple Vitamins-Minerals (CENTRUM SILVER PO) Take by mouth.    Salley Scarlet [facility]  Onychomycosis Nail Lacquer -  Fluconazole 2%, Terbinafine 1% DMSO Apply to affected nail once daily Qty. 120 gm 3 refills    . Omega-3 Fatty Acids (FISH OIL) 1000 MG CAPS Take by mouth.    . Probiotic Product (PROBIOTIC ADVANCED PO) Take by mouth.     No current facility-administered medications on file prior to visit.    She is allergic to other and sulfa antibiotics..  Review of Systems Review of Systems  Constitutional: Negative for activity change, appetite change and fatigue.  HENT: Negative for hearing loss, congestion, tinnitus and ear discharge.  dentist q30m Eyes: Negative for visual disturbance (see optho q1y -- vision corrected to 20/20 with glasses).  Respiratory: Negative for cough, chest tightness and shortness of breath.   Cardiovascular: Negative for chest pain, palpitations and leg swelling.  Gastrointestinal: Negative for abdominal pain, diarrhea, constipation and abdominal distention.  Genitourinary: Negative for urgency, frequency, decreased urine volume and difficulty urinating.  Musculoskeletal: Negative for back pain, arthralgias and gait problem.  Skin: Negative for color change, pallor and rash.  Neurological: Negative for dizziness, light-headedness, numbness and headaches.  Hematological: Negative for adenopathy. Does not bruise/bleed easily.  Psychiatric/Behavioral: Negative for suicidal ideas, confusion, sleep disturbance, self-injury, dysphoric mood,  decreased concentration and agitation.       Objective:    BP (!) 110/55 (BP Location: Left Arm, Cuff Size: Large)   Pulse 73   Temp 98.2 F (36.8 C) (Oral)   Resp 16   Ht 5' (1.524 m)   Wt 158 lb 6.4 oz (71.8 kg)   SpO2 99%   BMI 30.94 kg/m  General appearance: alert, cooperative, appears stated age and no distress Head: Normocephalic, without obvious abnormality, atraumatic Eyes: conjunctivae/corneas clear. PERRL, EOM's intact. Fundi benign. Ears: normal TM's and external ear canals both ears Nose: Nares normal. Septum midline. Mucosa normal. No drainage or sinus tenderness. Throat: lips, mucosa, and tongue normal; teeth and gums normal Neck: no adenopathy, no carotid bruit, no JVD, supple, symmetrical, trachea midline and thyroid not enlarged, symmetric, no tenderness/mass/nodules Back: symmetric, no curvature. ROM normal. No CVA tenderness. Lungs: clear to auscultation bilaterally Breasts: normal appearance, no masses or tenderness Heart: regular rate and rhythm, S1, S2 normal, no murmur, click, rub or gallop Abdomen: soft, non-tender; bowel sounds normal; no  masses,  no organomegaly Pelvic: not indicated; status post hysterectomy, negative ROS Extremities: extremities normal, atraumatic, no cyanosis or edema Pulses: 2+ and symmetric Skin: Skin color, texture, turgor normal. No rashes or lesions Lymph nodes: Cervical, supraclavicular, and axillary nodes normal. Neurologic: Alert and oriented X 3, normal strength and tone. Normal symmetric reflexes. Normal coordination and gait    Assessment:    Healthy female exam.     Plan:    ghm utd Check labs   See After Visit Summary for Counseling Recommendations   1. Fatigue, unspecified type Check labs  - CBC with Differential/Platelet; Future - Comprehensive metabolic panel; Future - Lipid panel - CBC with Differential/Platelet - Comprehensive metabolic panel - Thyroid Panel With TSH - POCT Urinalysis Dipstick  (Automated)  2. Ischemic heart disease screen  - CBC with Differential/Platelet; Future - Comprehensive metabolic panel; Future - Lipid panel - Comprehensive metabolic panel  3. Insomnia, unspecified type Due to IC--- urology can do nothing else Try restoril  - temazepam (RESTORIL) 15 MG capsule; Take 1 capsule (15 mg total) by mouth at bedtime as needed for sleep.  Dispense: 30 capsule; Refill: 2

## 2018-06-21 NOTE — Patient Instructions (Signed)
Preventive Care 40-64 Years, Female Preventive care refers to lifestyle choices and visits with your health care provider that can promote health and wellness. What does preventive care include?  A yearly physical exam. This is also called an annual well check.  Dental exams once or twice a year.  Routine eye exams. Ask your health care provider how often you should have your eyes checked.  Personal lifestyle choices, including: ? Daily care of your teeth and gums. ? Regular physical activity. ? Eating a healthy diet. ? Avoiding tobacco and drug use. ? Limiting alcohol use. ? Practicing safe sex. ? Taking low-dose aspirin daily starting at age 58. ? Taking vitamin and mineral supplements as recommended by your health care provider. What happens during an annual well check? The services and screenings done by your health care provider during your annual well check will depend on your age, overall health, lifestyle risk factors, and family history of disease. Counseling Your health care provider may ask you questions about your:  Alcohol use.  Tobacco use.  Drug use.  Emotional well-being.  Home and relationship well-being.  Sexual activity.  Eating habits.  Work and work Statistician.  Method of birth control.  Menstrual cycle.  Pregnancy history.  Screening You may have the following tests or measurements:  Height, weight, and BMI.  Blood pressure.  Lipid and cholesterol levels. These may be checked every 5 years, or more frequently if you are over 81 years old.  Skin check.  Lung cancer screening. You may have this screening every year starting at age 78 if you have a 30-pack-year history of smoking and currently smoke or have quit within the past 15 years.  Fecal occult blood test (FOBT) of the stool. You may have this test every year starting at age 65.  Flexible sigmoidoscopy or colonoscopy. You may have a sigmoidoscopy every 5 years or a colonoscopy  every 10 years starting at age 30.  Hepatitis C blood test.  Hepatitis B blood test.  Sexually transmitted disease (STD) testing.  Diabetes screening. This is done by checking your blood sugar (glucose) after you have not eaten for a while (fasting). You may have this done every 1-3 years.  Mammogram. This may be done every 1-2 years. Talk to your health care provider about when you should start having regular mammograms. This may depend on whether you have a family history of breast cancer.  BRCA-related cancer screening. This may be done if you have a family history of breast, ovarian, tubal, or peritoneal cancers.  Pelvic exam and Pap test. This may be done every 3 years starting at age 80. Starting at age 36, this may be done every 5 years if you have a Pap test in combination with an HPV test.  Bone density scan. This is done to screen for osteoporosis. You may have this scan if you are at high risk for osteoporosis.  Discuss your test results, treatment options, and if necessary, the need for more tests with your health care provider. Vaccines Your health care provider may recommend certain vaccines, such as:  Influenza vaccine. This is recommended every year.  Tetanus, diphtheria, and acellular pertussis (Tdap, Td) vaccine. You may need a Td booster every 10 years.  Varicella vaccine. You may need this if you have not been vaccinated.  Zoster vaccine. You may need this after age 5.  Measles, mumps, and rubella (MMR) vaccine. You may need at least one dose of MMR if you were born in  1958 or later. You may also need a second dose.  Pneumococcal 13-valent conjugate (PCV13) vaccine. You may need this if you have certain conditions and were not previously vaccinated.  Pneumococcal polysaccharide (PPSV23) vaccine. You may need one or two doses if you smoke cigarettes or if you have certain conditions.  Meningococcal vaccine. You may need this if you have certain  conditions.  Hepatitis A vaccine. You may need this if you have certain conditions or if you travel or work in places where you may be exposed to hepatitis A.  Hepatitis B vaccine. You may need this if you have certain conditions or if you travel or work in places where you may be exposed to hepatitis B.  Haemophilus influenzae type b (Hib) vaccine. You may need this if you have certain conditions.  Talk to your health care provider about which screenings and vaccines you need and how often you need them. This information is not intended to replace advice given to you by your health care provider. Make sure you discuss any questions you have with your health care provider. Document Released: 08/20/2015 Document Revised: 04/12/2016 Document Reviewed: 05/25/2015 [employer]vier Interactive Patient Education  2018 [employer]  

## 2018-06-21 NOTE — Patient Instructions (Signed)
Please schedule your next medicare wellness visit with me in 1 yr.  Bring a copy of your living will and/or healthcare power of attorney to your next office visit.  Please schedule mammogram    Tanya Griffith , Thank you for taking time to come for your Medicare Wellness Visit. I appreciate your ongoing commitment to your health goals. Please review the following plan we discussed and let me know if I can assist you in the future.   These are the goals we discussed: Goals    . Lose weight through exercise.    . To be more positive       This is a list of the screening recommended for you and due dates:  Health Maintenance  Topic Date Due  . Mammogram  06/18/2018  . Flu Shot  03/08/2019*  . Tetanus Vaccine  06/21/2024*  . DEXA scan (bone density measurement)  11/30/2018  . Cologuard (Stool DNA test)  09/19/2019  .  Hepatitis C: One time screening is recommended by Center for Disease Control  (CDC) for  adults born from [birth year] through 1965.   Completed  . HIV Screening  Completed  *Topic was postponed. The date shown is not the original due date.    Health Maintenance for Postmenopausal Women Menopause is a normal process in which your reproductive ability comes to an end. This process happens gradually over a span of months to years, usually between the ages of 85 and 63. Menopause is complete when you have missed 12 consecutive menstrual periods. It is important to talk with your health care provider about some of the most common conditions that affect postmenopausal women, such as heart disease, cancer, and bone loss (osteoporosis). Adopting a healthy lifestyle and getting preventive care can help to promote your health and wellness. Those actions can also lower your chances of developing some of these common conditions. What should I know about menopause? During menopause, you may experience a number of symptoms, such as:  Moderate-to-severe hot flashes.  Night sweats.  Decrease  in sex drive.  Mood swings.  Headaches.  Tiredness.  Irritability.  Memory problems.  Insomnia.  Choosing to treat or not to treat menopausal changes is an individual decision that you make with your health care provider. What should I know about hormone replacement therapy and supplements? Hormone therapy products are effective for treating symptoms that are associated with menopause, such as hot flashes and night sweats. Hormone replacement carries certain risks, especially as you become older. If you are thinking about using estrogen or estrogen with progestin treatments, discuss the benefits and risks with your health care provider. What should I know about heart disease and stroke? Heart disease, heart attack, and stroke become more likely as you age. This may be due, in part, to the hormonal changes that your body experiences during menopause. These can affect how your body processes dietary fats, triglycerides, and cholesterol. Heart attack and stroke are both medical emergencies. There are many things that you can do to help prevent heart disease and stroke:  Have your blood pressure checked at least every 1-2 years. High blood pressure causes heart disease and increases the risk of stroke.  If you are 97-89 years old, ask your health care provider if you should take aspirin to prevent a heart attack or a stroke.  Do not use any tobacco products, including cigarettes, chewing tobacco, or electronic cigarettes. If you need help quitting, ask your health care provider.  It is important  to eat a healthy diet and maintain a healthy weight. ? Be sure to include plenty of vegetables, fruits, low-fat dairy products, and lean protein. ? Avoid eating foods that are high in solid fats, added sugars, or salt (sodium).  Get regular exercise. This is one of the most important things that you can do for your health. ? Try to exercise for at least 150 minutes each week. The type of exercise  that you do should increase your heart rate and make you sweat. This is known as moderate-intensity exercise. ? Try to do strengthening exercises at least twice each week. Do these in addition to the moderate-intensity exercise.  Know your numbers.Ask your health care provider to check your cholesterol and your blood glucose. Continue to have your blood tested as directed by your health care provider.  What should I know about cancer screening? There are several types of cancer. Take the following steps to reduce your risk and to catch any cancer development as early as possible. Breast Cancer  Practice breast self-awareness. ? This means understanding how your breasts normally appear and feel. ? It also means doing regular breast self-exams. Let your health care provider know about any changes, no matter how small.  If you are 60 or older, have a clinician do a breast exam (clinical breast exam or CBE) every year. Depending on your age, family history, and medical history, it may be recommended that you also have a yearly breast X-ray (mammogram).  If you have a family history of breast cancer, talk with your health care provider about genetic screening.  If you are at high risk for breast cancer, talk with your health care provider about having an MRI and a mammogram every year.  Breast cancer (BRCA) gene test is recommended for women who have family members with BRCA-related cancers. Results of the assessment will determine the need for genetic counseling and BRCA1 and for BRCA2 testing. BRCA-related cancers include these types: ? Breast. This occurs in males or females. ? Ovarian. ? Tubal. This may also be called fallopian tube cancer. ? Cancer of the abdominal or pelvic lining (peritoneal cancer). ? Prostate. ? Pancreatic.  Cervical, Uterine, and Ovarian Cancer Your health care provider may recommend that you be screened regularly for cancer of the pelvic organs. These include your  ovaries, uterus, and vagina. This screening involves a pelvic exam, which includes checking for microscopic changes to the surface of your cervix (Pap test).  For women ages 21-65, health care providers may recommend a pelvic exam and a Pap test every three years. For women ages 29-65, they may recommend the Pap test and pelvic exam, combined with testing for human papilloma virus (HPV), every five years. Some types of HPV increase your risk of cervical cancer. Testing for HPV may also be done on women of any age who have unclear Pap test results.  Other health care providers may not recommend any screening for nonpregnant women who are considered low risk for pelvic cancer and have no symptoms. Ask your health care provider if a screening pelvic exam is right for you.  If you have had past treatment for cervical cancer or a condition that could lead to cancer, you need Pap tests and screening for cancer for at least 20 years after your treatment. If Pap tests have been discontinued for you, your risk factors (such as having a new sexual partner) need to be reassessed to determine if you should start having screenings again. Some women  have medical problems that increase the chance of getting cervical cancer. In these cases, your health care provider may recommend that you have screening and Pap tests more often.  If you have a family history of uterine cancer or ovarian cancer, talk with your health care provider about genetic screening.  If you have vaginal bleeding after reaching menopause, tell your health care provider.  There are currently no reliable tests available to screen for ovarian cancer.  Lung Cancer Lung cancer screening is recommended for adults 3-40 years old who are at high risk for lung cancer because of a history of smoking. A yearly low-dose CT scan of the lungs is recommended if you:  Currently smoke.  Have a history of at least 30 pack-years of smoking and you currently  smoke or have quit within the past 15 years. A pack-year is smoking an average of one pack of cigarettes per day for one year.  Yearly screening should:  Continue until it has been 15 years since you quit.  Stop if you develop a health problem that would prevent you from having lung cancer treatment.  Colorectal Cancer  This type of cancer can be detected and can often be prevented.  Routine colorectal cancer screening usually begins at age 66 and continues through age 35.  If you have risk factors for colon cancer, your health care provider may recommend that you be screened at an earlier age.  If you have a family history of colorectal cancer, talk with your health care provider about genetic screening.  Your health care provider may also recommend using home test kits to check for hidden blood in your stool.  A small camera at the end of a tube can be used to examine your colon directly (sigmoidoscopy or colonoscopy). This is done to check for the earliest forms of colorectal cancer.  Direct examination of the colon should be repeated every 5-10 years until age 70. However, if early forms of precancerous polyps or small growths are found or if you have a family history or genetic risk for colorectal cancer, you may need to be screened more often.  Skin Cancer  Check your skin from head to toe regularly.  Monitor any moles. Be sure to tell your health care provider: ? About any new moles or changes in moles, especially if there is a change in a mole's shape or color. ? If you have a mole that is larger than the size of a pencil eraser.  If any of your family members has a history of skin cancer, especially at a young age, talk with your health care provider about genetic screening.  Always use sunscreen. Apply sunscreen liberally and repeatedly throughout the day.  Whenever you are outside, protect yourself by wearing long sleeves, pants, a wide-brimmed hat, and  sunglasses.  What should I know about osteoporosis? Osteoporosis is a condition in which bone destruction happens more quickly than new bone creation. After menopause, you may be at an increased risk for osteoporosis. To help prevent osteoporosis or the bone fractures that can happen because of osteoporosis, the following is recommended:  If you are 22-46 years old, get at least 1,000 mg of calcium and at least 600 mg of vitamin D per day.  If you are older than age 30 but younger than age 40, get at least 1,200 mg of calcium and at least 600 mg of vitamin D per day.  If you are older than age 30, get at least  1,200 mg of calcium and at least 800 mg of vitamin D per day.  Smoking and excessive alcohol intake increase the risk of osteoporosis. Eat foods that are rich in calcium and vitamin D, and do weight-bearing exercises several times each week as directed by your health care provider. What should I know about how menopause affects my mental health? Depression may occur at any age, but it is more common as you become older. Common symptoms of depression include:  Low or sad mood.  Changes in sleep patterns.  Changes in appetite or eating patterns.  Feeling an overall lack of motivation or enjoyment of activities that you previously enjoyed.  Frequent crying spells.  Talk with your health care provider if you think that you are experiencing depression. What should I know about immunizations? It is important that you get and maintain your immunizations. These include:  Tetanus, diphtheria, and pertussis (Tdap) booster vaccine.  Influenza every year before the flu season begins.  Pneumonia vaccine.  Shingles vaccine.  Your health care provider may also recommend other immunizations. This information is not intended to replace advice given to you by your health care provider. Make sure you discuss any questions you have with your health care provider. Document Released:  09/15/2005 Document Revised: 02/11/2016 Document Reviewed: 04/27/2015 Elsevier Interactive Patient Education  2018 [employer].

## 2018-06-22 ENCOUNTER — Encounter: Payer: Self-pay | Admitting: Family Medicine

## 2018-06-22 LAB — CBC WITH DIFFERENTIAL/PLATELET
BASOS PCT: 1.2 %
Basophils Absolute: 83 cells/uL (ref 0–200)
EOS ABS: 283 {cells}/uL (ref 15–500)
EOS PCT: 4.1 %
HCT: 38.3 % (ref 35.0–45.0)
HEMOGLOBIN: 13.1 g/dL (ref 11.7–15.5)
Lymphs Abs: 1877 cells/uL (ref 850–3900)
MCH: 33.7 pg — AB (ref 27.0–33.0)
MCHC: 34.2 g/dL (ref 32.0–36.0)
MCV: 98.5 fL (ref 80.0–100.0)
MONOS PCT: 7.5 %
MPV: 10.7 fL (ref 7.5–12.5)
Neutro Abs: 4140 cells/uL (ref 1500–7800)
Neutrophils Relative %: 60 %
PLATELETS: 320 10*3/uL (ref 140–400)
RBC: 3.89 10*6/uL (ref 3.80–5.10)
RDW: 11.7 % (ref 11.0–15.0)
TOTAL LYMPHOCYTE: 27.2 %
WBC mixed population: 518 cells/uL (ref 200–950)
WBC: 6.9 10*3/uL (ref 3.8–10.8)

## 2018-06-22 LAB — THYROID PANEL WITH TSH
Free Thyroxine Index: 2 (ref 1.4–3.8)
T3 UPTAKE: 31 % (ref 22–35)
T4, Total: 6.5 ug/dL (ref 5.1–11.9)
TSH: 3.36 m[IU]/L (ref 0.40–4.50)

## 2018-06-22 LAB — LIPID PANEL
CHOL/HDL RATIO: 3.4 (calc) (ref <5.0)
CHOLESTEROL: 225 mg/dL — AB (ref <200)
HDL: 66 mg/dL (ref >50)
LDL CHOLESTEROL (CALC): 122 mg/dL — AB
Non-HDL Cholesterol (Calc): 159 mg/dL (calc) — ABNORMAL HIGH (ref <130)
Triglycerides: 243 mg/dL — ABNORMAL HIGH (ref <150)

## 2018-06-22 LAB — COMPREHENSIVE METABOLIC PANEL
AG Ratio: 1.3 (calc) (ref 1.0–2.5)
ALBUMIN MSPROF: 4 g/dL (ref 3.6–5.1)
ALKALINE PHOSPHATASE (APISO): 59 U/L (ref 33–130)
ALT: 13 U/L (ref 6–29)
AST: 20 U/L (ref 10–35)
BILIRUBIN TOTAL: 0.3 mg/dL (ref 0.2–1.2)
BUN: 16 mg/dL (ref 7–25)
CALCIUM: 9.4 mg/dL (ref 8.6–10.4)
CO2: 26 mmol/L (ref 20–32)
Chloride: 104 mmol/L (ref 98–110)
Creat: 0.68 mg/dL (ref 0.50–0.99)
Globulin: 3.1 g/dL (calc) (ref 1.9–3.7)
Glucose, Bld: 94 mg/dL (ref 65–99)
Potassium: 4.7 mmol/L (ref 3.5–5.3)
Sodium: 140 mmol/L (ref 135–146)
Total Protein: 7.1 g/dL (ref 6.1–8.1)

## 2018-06-24 NOTE — Addendum Note (Signed)
Addended byDamita Dunnings D on: 06/24/2018 04:41 PM   Modules accepted: Orders

## 2018-06-25 ENCOUNTER — Ambulatory Visit (HOSPITAL_BASED_OUTPATIENT_CLINIC_OR_DEPARTMENT_OTHER)
Admission: RE | Admit: 2018-06-25 | Discharge: 2018-06-25 | Disposition: A | Discharge status: Home / Self Care | Payer: Medicare HMO | Source: Ambulatory Visit | Attending: Family Medicine | Admitting: Family Medicine

## 2018-06-25 ENCOUNTER — Encounter (HOSPITAL_BASED_OUTPATIENT_CLINIC_OR_DEPARTMENT_OTHER): Payer: Self-pay

## 2018-06-25 DIAGNOSIS — Z1231 Encounter for screening mammogram for malignant neoplasm of breast: Secondary | ICD-10-CM | POA: Insufficient documentation

## 2018-09-25 DIAGNOSIS — L821 Other seborrheic keratosis: Secondary | ICD-10-CM | POA: Diagnosis not present

## 2018-09-25 DIAGNOSIS — L814 Other melanin hyperpigmentation: Secondary | ICD-10-CM | POA: Diagnosis not present

## 2018-09-25 DIAGNOSIS — L578 Other skin changes due to chronic exposure to nonionizing radiation: Secondary | ICD-10-CM | POA: Diagnosis not present

## 2019-01-20 ENCOUNTER — Ambulatory Visit (INDEPENDENT_AMBULATORY_CARE_PROVIDER_SITE_OTHER): Payer: Medicare HMO | Admitting: Family Medicine

## 2019-01-20 ENCOUNTER — Other Ambulatory Visit: Payer: Self-pay

## 2019-01-20 ENCOUNTER — Encounter: Payer: Self-pay | Admitting: Family Medicine

## 2019-01-20 VITALS — BP 153/70 | HR 70 | Temp 98.5°F | Resp 18 | Ht 60.0 in | Wt 158.0 lb

## 2019-01-20 DIAGNOSIS — R42 Dizziness and giddiness: Secondary | ICD-10-CM

## 2019-01-20 DIAGNOSIS — R222 Localized swelling, mass and lump, trunk: Secondary | ICD-10-CM | POA: Diagnosis not present

## 2019-01-20 MED ORDER — FLUTICASONE PROPIONATE 50 MCG/ACT NA SUSP: 2.0000 | Freq: Every day | NASAL | 6 refills | Status: DC

## 2019-01-20 MED ORDER — MECLIZINE HCL 25 MG PO TABS: 25.0000 mg | ORAL_TABLET | Freq: Three times a day (TID) | ORAL | 0 refills | Status: DC | PRN

## 2019-01-20 MED ORDER — ERYTHROMYCIN 5 MG/GM OP OINT: 1.0000 "application " | TOPICAL_OINTMENT | Freq: Every day | OPHTHALMIC | 1 refills | Status: DC

## 2019-01-20 NOTE — Patient Instructions (Signed)
Lipoma    A lipoma is a noncancerous (benign) tumor that is made up of fat cells. This is a very common type of soft-tissue growth. Lipomas are usually found under the skin (subcutaneous). They may occur in any tissue of the body that contains fat. Common areas for lipomas to appear include the back, shoulders, buttocks, and thighs.   Lipomas grow slowly, and they are usually painless. Most lipomas do not cause problems and do not require treatment.  What are the causes?  The cause of this condition is not known.  What increases the risk?  You are more likely to develop this condition if:   You are 40-60 years old.   You have a family history of lipomas.  What are the signs or symptoms?  A lipoma usually appears as a small, round bump under the skin. In most cases, the lump will:   Feel soft or rubbery.   Not cause pain or other symptoms.  However, if a lipoma is located in an area where it pushes on nerves, it can become painful or cause other symptoms.  How is this diagnosed?  A lipoma can usually be diagnosed with a physical exam. You may also have tests to confirm the diagnosis and to rule out other conditions. Tests may include:   Imaging tests, such as a CT scan or MRI.   Removal of a tissue sample to be looked at under a microscope (biopsy).  How is this treated?  Treatment for this condition depends on the size of the lipoma and whether it is causing any symptoms.   For small lipomas that are not causing problems, no treatment is needed.   If a lipoma is bigger or it causes problems, surgery may be done to remove the lipoma. Lipomas can also be removed to improve appearance. Most often, the procedure is done after applying a medicine that numbs the area (local anesthetic).  Follow these instructions at home:   Watch your lipoma for any changes.   Keep all follow-up visits as told by your health care provider. This is important.  Contact a health care provider if:   Your lipoma becomes larger or  hard.   Your lipoma becomes painful, red, or increasingly swollen. These could be signs of infection or a more serious condition.  Get help right away if:   You develop tingling or numbness in an area near the lipoma. This could indicate that the lipoma is causing nerve damage.  Summary   A lipoma is a noncancerous tumor that is made up of fat cells.   Most lipomas do not cause problems and do not require treatment.   If a lipoma is bigger or it causes problems, surgery may be done to remove the lipoma.  This information is not intended to replace advice given to you by your health care provider. Make sure you discuss any questions you have with your health care provider.  Document Released: 07/14/2002 Document Revised: 07/10/2017 Document Reviewed: 07/10/2017  Elsevier Interactive Patient Education  2019 [employer] Inc[employer]

## 2019-01-20 NOTE — Assessment & Plan Note (Signed)
Stable Uses meclizine only occassionally

## 2019-01-20 NOTE — Progress Notes (Signed)
Patient ID: Tanya Griffith, female    DOB: 01/04/1956  Age: 63 y.o. MRN: [id number]    Subjective:  Subjective  HPI Tanya Griffith presents for a mass lat to her L scapula.   She noticed it recently    No pain, no d/c  Review of Systems  Constitutional: Negative for appetite change, diaphoresis, fatigue and unexpected weight change.  Eyes: Negative for pain, redness and visual disturbance.  Respiratory: Negative for cough, chest tightness, shortness of breath and wheezing.   Cardiovascular: Negative for chest pain, palpitations and leg swelling.  Endocrine: Negative for cold intolerance, heat intolerance, polydipsia, polyphagia and polyuria.  Genitourinary: Negative for difficulty urinating, dysuria and frequency.  Neurological: Negative for dizziness, light-headedness, numbness and headaches.    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   Her family history includes Alzheimer's disease in her mother; Arthritis in her brother, maternal aunt, maternal grandfather, and mother; Breast cancer in her unknown relative; Cancer in her sister; Dementia in her mother; Diabetes in her brother and maternal aunt; Heart attack in her paternal grandfather; Hyperlipidemia in her mother; Hypertension in her brother; Kidney disease (age of onset: 19) in her mother; Transient ischemic attack in her maternal aunt and mother.She reports that she has never smoked. She has never used smokeless tobacco. She reports that she does not drink  alcohol or use drugs.  Current Outpatient Medications on File Prior to Visit  Medication Sig Dispense Refill   aspirin 325 MG tablet Take 325 mg by mouth daily.     BIOTIN PO Take 10,000 mcg by mouth daily.      Calcium-Magnesium (CAL-MAG PO) Take 3 tablets by mouth. Ca 1500 and mag 1000mg      Flaxseed, Linseed, (FLAX SEED OIL) 1000 MG CAPS Take by mouth.     Glucosamine 500 MG CAPS Take by mouth daily.     Multiple Vitamins-Minerals (CENTRUM SILVER PO) Take by mouth.     Omega-3 Fatty Acids (FISH OIL) 1000 MG CAPS Take by mouth.     Probiotic Product (PROBIOTIC ADVANCED PO) Take by mouth.     B Complex-C-Folic Acid (STRESS B COMPLEX PO) Take by mouth.     L-Lysine 500 MG TABS Take by mouth.     MELATONIN PO Take by mouth at bedtime.     Methylsulfonylmethane (MSM) 1000 MG TABS Take by mouth. 5 mg With glucosasmine hcl 1500mg      NON FORMULARY [facility]  Onychomycosis Nail Lacquer -  Fluconazole 2%, Terbinafine 1% DMSO Apply to affected nail once daily Qty. 120 gm 3 refills     temazepam (RESTORIL) 15 MG capsule Take 1 capsule (15 mg total) by mouth at bedtime as needed for sleep. (Patient not taking: Reported on 01/20/2019) 30 capsule 2   No current facility-administered medications on file prior to visit.      Objective:  Objective  Physical Exam Vitals signs and nursing note reviewed.  Constitutional:      Appearance: She is well-developed.  HENT:  Head: Normocephalic and atraumatic.  Eyes:     Conjunctiva/sclera: Conjunctivae normal.  Neck:     Musculoskeletal: Normal range of motion and neck supple.     Thyroid: No thyromegaly.     Vascular: No carotid bruit or JVD.  Cardiovascular:     Rate and Rhythm: Normal rate and regular rhythm.     Heart sounds: Normal heart sounds. No murmur.  Pulmonary:     Effort: Pulmonary effort is normal. No respiratory distress.     Breath sounds: Normal breath sounds. No wheezing or rales.  Chest:     Chest  wall: No tenderness.  Musculoskeletal:       Arms:  Neurological:     Mental Status: She is alert and oriented to person, place, and time.    BP (!) 153/70 (BP Location: Right Arm, Patient Position: Sitting, Cuff Size: Normal)    Pulse 70    Temp 98.5 F (36.9 C) (Oral)    Resp 18    Ht 5' (1.524 m)    Wt 158 lb (71.7 kg)    SpO2 98%    BMI 30.86 kg/m  Wt Readings from Last 3 Encounters:  01/20/19 158 lb (71.7 kg)  06/21/18 158 lb (71.7 kg)  06/21/18 158 lb 6.4 oz (71.8 kg)     Lab Results  Component Value Date   WBC 6.9 06/21/2018   HGB 13.1 06/21/2018   HCT 38.3 06/21/2018   PLT 320 06/21/2018   GLUCOSE 94 06/21/2018   CHOL 225 (H) 06/21/2018   TRIG 243 (H) 06/21/2018   HDL 66 06/21/2018   LDLCALC 122 (H) 06/21/2018   ALT 13 06/21/2018   AST 20 06/21/2018   NA 140 06/21/2018   K 4.7 06/21/2018   CL 104 06/21/2018   CREATININE 0.68 06/21/2018   BUN 16 06/21/2018   CO2 26 06/21/2018   TSH 3.36 06/21/2018   HGBA1C 5.6 06/08/2014    Mm 3d Screen Breast Bilateral  Result Date: 06/26/2018 CLINICAL DATA:  Screening. EXAM: DIGITAL SCREENING BILATERAL MAMMOGRAM WITH TOMO AND CAD COMPARISON:  Previous exam(s). ACR Breast Density Category b: There are scattered areas of fibroglandular density. FINDINGS: There are no findings suspicious for malignancy. Images were processed with CAD. IMPRESSION: No mammographic evidence of malignancy. A result letter of this screening mammogram will be mailed directly to the patient. RECOMMENDATION: Screening mammogram in one year. (Code:SM-B-01Y) BI-RADS CATEGORY  1: Negative. Electronically Signed   By: Fidela Salisbury M.D.   On: 06/26/2018 16:05     Assessment & Plan:  Plan  I am having Clearnce Sorrel. Raford Pitcher maintain her Fish Oil, MSM, Calcium-Magnesium (CAL-MAG PO), Flax Seed Oil, Multiple Vitamins-Minerals (CENTRUM SILVER PO), Probiotic Product (PROBIOTIC ADVANCED PO), BIOTIN PO, aspirin, NON FORMULARY, MELATONIN PO, L-Lysine, B  Complex-C-Folic Acid (STRESS B COMPLEX PO), temazepam, erythromycin, meclizine, fluticasone, and Glucosamine.  Meds ordered this encounter  Medications   erythromycin ophthalmic ointment    Sig: Place 1 application into both eyes at bedtime.    Dispense:  3.5 g    Refill:  1   meclizine (ANTIVERT) 25 MG tablet    Sig: Take 1 tablet (25 mg total) by mouth 3 (three) times daily as needed for dizziness.    Dispense:  30 tablet    Refill:  0   fluticasone (FLONASE) 50 MCG/ACT nasal spray    Sig: Place 2 sprays into both nostrils daily.    Dispense:  16 g    Refill:  6  Problem List Items Addressed This Visit      Unprioritized   Vertigo    Stable Uses meclizine only occassionally       Other Visit Diagnoses    Mass on back    -  Primary   Relevant Orders   Ambulatory referral to General Surgery   Dizzy       Relevant Medications   meclizine (ANTIVERT) 25 MG tablet   fluticasone (FLONASE) 50 MCG/ACT nasal spray      Follow-up: Return if symptoms worsen or fail to improve.  Ann Held, DO

## 2019-01-21 ENCOUNTER — Telehealth: Payer: Self-pay

## 2019-01-21 NOTE — Telephone Encounter (Signed)
Copied from [place] [id number]. Topic: General - Other >> Jan 21, 2019  2:07 PM Rayann Heman wrote: Reason for CRM: pt called and stated that she would like to know if the ultrasound can be placed for mass on back

## 2019-01-22 ENCOUNTER — Other Ambulatory Visit: Payer: Self-pay | Admitting: Family Medicine

## 2019-01-22 NOTE — Telephone Encounter (Signed)
We discussed this when we did virtual visit----surgeon will order imaging if needed

## 2019-01-22 NOTE — Telephone Encounter (Signed)
Pt notifed.

## 2019-01-28 DIAGNOSIS — R222 Localized swelling, mass and lump, trunk: Secondary | ICD-10-CM | POA: Diagnosis not present

## 2019-01-30 ENCOUNTER — Other Ambulatory Visit: Payer: Self-pay | Admitting: Family Medicine

## 2019-01-30 DIAGNOSIS — R222 Localized swelling, mass and lump, trunk: Secondary | ICD-10-CM | POA: Diagnosis not present

## 2019-01-30 NOTE — Telephone Encounter (Signed)
Pt called and stated that the surgeon that she was sent to was very young and just out of school so she would like someone else. Pt also stated that the surgeon is going to be gone for a week and would like an order for an MRI to be placed for mass. Pt would like a call back from Dr Carollee Herter    [phone]

## 2019-01-30 NOTE — Telephone Encounter (Signed)
Pt saw surgeon and ct done--- mri recommended but surgeon is out of office this week Pt will drop off copy of ct tomorrow

## 2019-01-30 NOTE — Telephone Encounter (Signed)
Please advise 

## 2019-01-31 ENCOUNTER — Other Ambulatory Visit: Payer: Self-pay | Admitting: Family Medicine

## 2019-01-31 ENCOUNTER — Telehealth: Payer: Self-pay | Admitting: Family Medicine

## 2019-01-31 ENCOUNTER — Encounter: Payer: Self-pay | Admitting: Family Medicine

## 2019-01-31 DIAGNOSIS — R2231 Localized swelling, mass and lump, right upper limb: Secondary | ICD-10-CM

## 2019-01-31 NOTE — Addendum Note (Signed)
Addended by: Roma Schanz R on: 01/31/2019 04:36 PM   Modules accepted: Orders

## 2019-01-31 NOTE — Telephone Encounter (Signed)
Pt called stating results for CT were faxed 5:00pm 6/25 from [facility]. I have pulled from fax and given to Dr. Carollee Herter. Pt would like MRI at [facility] or [place] since they have the proper equipment. Pt requesting MRI be ordered STAT as she was told they are backed up for routine testing due to [place]. Pt asking for recommendation of surgeon for tumor removal as well.

## 2019-01-31 NOTE — Telephone Encounter (Signed)
She was supposed to drop off the results today

## 2019-01-31 NOTE — Telephone Encounter (Signed)
Pt called requesting an update. Pt states she is experiencing pain and anxiety. Pt is requesting that this be taken care of today. Pt states the tumor is moving when she moves her arm. Please advise.

## 2019-02-03 NOTE — Telephone Encounter (Signed)
We got it ---- MRI ordered  We are calling [facility] to see if they would do bx or if we need to refer to Thoracic surgery Pt wanted to see a different surgeon if they will do it

## 2019-02-03 NOTE — Telephone Encounter (Signed)
FYI to [facility] to look for results?

## 2019-02-04 ENCOUNTER — Other Ambulatory Visit: Payer: Self-pay | Admitting: Family Medicine

## 2019-02-04 DIAGNOSIS — R222 Localized swelling, mass and lump, trunk: Secondary | ICD-10-CM

## 2019-02-05 ENCOUNTER — Other Ambulatory Visit: Payer: Self-pay

## 2019-02-05 ENCOUNTER — Ambulatory Visit (HOSPITAL_COMMUNITY)
Admission: RE | Admit: 2019-02-05 | Discharge: 2019-02-05 | Disposition: A | Discharge status: Home / Self Care | Payer: Medicare HMO | Source: Ambulatory Visit | Attending: Family Medicine | Admitting: Family Medicine

## 2019-02-05 DIAGNOSIS — R2231 Localized swelling, mass and lump, right upper limb: Secondary | ICD-10-CM

## 2019-02-05 DIAGNOSIS — R222 Localized swelling, mass and lump, trunk: Secondary | ICD-10-CM | POA: Diagnosis not present

## 2019-02-05 LAB — POCT I-STAT CREATININE: Creatinine, Ser: 0.7 mg/dL (ref 0.44–1.00)

## 2019-02-05 MED ORDER — GADOBUTROL 1 MMOL/ML IV SOLN
7.0000 mL | Freq: Once | INTRAVENOUS | Status: AC | PRN
  Administered 2019-02-05: 7 mL via INTRAVENOUS

## 2019-02-11 DIAGNOSIS — D219 Benign neoplasm of connective and other soft tissue, unspecified: Secondary | ICD-10-CM | POA: Insufficient documentation

## 2019-02-11 DIAGNOSIS — D213 Benign neoplasm of connective and other soft tissue of thorax: Secondary | ICD-10-CM | POA: Diagnosis not present

## 2019-02-11 DIAGNOSIS — D2112 Benign neoplasm of connective and other soft tissue of left upper limb, including shoulder: Secondary | ICD-10-CM | POA: Diagnosis not present

## 2019-02-14 DIAGNOSIS — R222 Localized swelling, mass and lump, trunk: Secondary | ICD-10-CM | POA: Diagnosis not present

## 2019-02-26 ENCOUNTER — Encounter: Payer: Self-pay | Admitting: Family Medicine

## 2019-02-26 ENCOUNTER — Other Ambulatory Visit: Payer: Self-pay

## 2019-02-26 ENCOUNTER — Ambulatory Visit (INDEPENDENT_AMBULATORY_CARE_PROVIDER_SITE_OTHER): Payer: Medicare HMO | Admitting: Family Medicine

## 2019-02-26 DIAGNOSIS — N3010 Interstitial cystitis (chronic) without hematuria: Secondary | ICD-10-CM

## 2019-02-26 DIAGNOSIS — R222 Localized swelling, mass and lump, trunk: Secondary | ICD-10-CM | POA: Insufficient documentation

## 2019-02-26 NOTE — Assessment & Plan Note (Signed)
Per surgeon at [facility]---  They will monitor with q64m mri

## 2019-02-26 NOTE — Progress Notes (Signed)
Virtual Visit via Video Note  I connected with Crissie Figures on 02/26/19 at 11:15 AM EDT by a video enabled telemedicine application and verified that I am speaking with the correct person using two identifiers.  Location: Patient: home  Provider: home    I discussed the limitations of evaluation and management by telemedicine and the availability of in person appointments. The patient expressed understanding and agreed to proceed.  History of Present Illness: Pt saw surgeon for mass and it was recommended it just be watched due to the surgery potentially causing more pain.  They will do q22m MRI and keep and eye on it. She has also started taking cbd oil and is sleeping better and her IC is better.     Observations/Objective: No vitals obtained  Pt is in NAD   Assessment and Plan: 1. Chronic interstitial cystitis  Cbd is helping  Pt is very excited about this because this is the first thing that gave her relief Pt was warned it could show in a uds as THC   2. Chest wall mass  Per surgeon at [facility]---  They will monitor with q53m mri  Follow Up Instructions:    I discussed the assessment and treatment plan with the patient. The patient was provided an opportunity to ask questions and all were answered. The patient agreed with the plan and demonstrated an understanding of the instructions.   The patient was advised to call back or seek an in-person evaluation if the symptoms worsen or if the condition fails to improve as anticipated.  I provided 15 minutes of non-face-to-face time during this encounter.   Ann Held, DO

## 2019-02-26 NOTE — Assessment & Plan Note (Signed)
Cbd is helping  Pt is very excited about this because this is the first thing that gave her relief Pt was warned it could show in a uds as THC

## 2019-03-23 ENCOUNTER — Encounter: Payer: Self-pay | Admitting: Family Medicine

## 2019-06-17 ENCOUNTER — Telehealth: Payer: Self-pay | Admitting: Family Medicine

## 2019-06-17 NOTE — Telephone Encounter (Signed)
Attempted to call patient to schedule Annual Wellness Visit, but patient did not answer. Will try to call patient again at a later time. SF °

## 2019-07-15 ENCOUNTER — Other Ambulatory Visit: Payer: Self-pay

## 2019-07-16 ENCOUNTER — Encounter: Payer: Self-pay | Admitting: Family Medicine

## 2019-07-16 ENCOUNTER — Encounter: Payer: Self-pay | Admitting: Gastroenterology

## 2019-07-16 ENCOUNTER — Ambulatory Visit (INDEPENDENT_AMBULATORY_CARE_PROVIDER_SITE_OTHER): Payer: Medicare HMO | Admitting: Family Medicine

## 2019-07-16 VITALS — HR 94 | Temp 97.9°F | Ht 60.0 in | Wt 145.0 lb

## 2019-07-16 DIAGNOSIS — Z20822 Contact with and (suspected) exposure to covid-19: Secondary | ICD-10-CM

## 2019-07-16 DIAGNOSIS — Z20828 Contact with and (suspected) exposure to other viral communicable diseases: Secondary | ICD-10-CM

## 2019-07-16 DIAGNOSIS — R634 Abnormal weight loss: Secondary | ICD-10-CM

## 2019-07-16 DIAGNOSIS — R195 Other fecal abnormalities: Secondary | ICD-10-CM | POA: Insufficient documentation

## 2019-07-16 NOTE — Assessment & Plan Note (Signed)
Since June/ July With weight loss 10 lbs since then and dec appetite Will refer to GI

## 2019-07-16 NOTE — Assessment & Plan Note (Signed)
10 lbs since June/july per pt Lose of appetite Refer to GI

## 2019-07-16 NOTE — Assessment & Plan Note (Signed)
Pt is quarantining at home Wait 6-7 days to get tested  Call office if symptoms develop If sob -- go to ER

## 2019-07-16 NOTE — Progress Notes (Signed)
Virtual Visit via Video Note  I connected with Tanya Griffith on 07/16/19 at 10:00 AM EST by a video enabled telemedicine application and verified that I am speaking with the correct person using two identifiers.  Location: Patient: home with husband Provider: home    I discussed the limitations of evaluation and management by telemedicine and the availability of in person appointments. The patient expressed understanding and agreed to proceed.  History of Present Illness: Pt is home.  She had a worker in her house over the weekend--- they were all wearing masks and shields    Pt was called yesterday that the worker tested positive for covid.  They have quarantined since  Pt has no symptoms    Observations/Objective: Vitals:   07/16/19 0953  Pulse: 94  Temp: 97.9 F (36.6 C)  SpO2: 96%   Pt is in NAD  Assessment and Plan: 1. Weight loss 10 lbs since June/july per pt Lose of appetite Refer to GI  - Ambulatory referral to Gastroenterology  2. Loose stools Since June/ July With weight loss 10 lbs since then and dec appetite Will refer to GI  - Ambulatory referral to Gastroenterology  3. Close exposure to COVID-19 virus Pt is quarantining at home Wait 6-7 days to get tested  Call office if symptoms develop If sob -- go to ER    Follow Up Instructions:    I discussed the assessment and treatment plan with the patient. The patient was provided an opportunity to ask questions and all were answered. The patient agreed with the plan and demonstrated an understanding of the instructions.   The patient was advised to call back or seek an in-person evaluation if the symptoms worsen or if the condition fails to improve as anticipated.  I provided 15 minutes of non-face-to-face time during this encounter.   Ann Held, DO

## 2019-07-19 DIAGNOSIS — S52572A Other intraarticular fracture of lower end of left radius, initial encounter for closed fracture: Secondary | ICD-10-CM | POA: Diagnosis not present

## 2019-07-19 DIAGNOSIS — Z79899 Other long term (current) drug therapy: Secondary | ICD-10-CM | POA: Diagnosis not present

## 2019-07-19 DIAGNOSIS — S0990XA Unspecified injury of head, initial encounter: Secondary | ICD-10-CM | POA: Diagnosis not present

## 2019-07-19 DIAGNOSIS — M25532 Pain in left wrist: Secondary | ICD-10-CM | POA: Diagnosis not present

## 2019-07-19 DIAGNOSIS — Z043 Encounter for examination and observation following other accident: Secondary | ICD-10-CM | POA: Diagnosis not present

## 2019-07-19 DIAGNOSIS — S0083XA Contusion of other part of head, initial encounter: Secondary | ICD-10-CM | POA: Diagnosis not present

## 2019-07-19 DIAGNOSIS — Z7982 Long term (current) use of aspirin: Secondary | ICD-10-CM | POA: Diagnosis not present

## 2019-07-19 DIAGNOSIS — S199XXA Unspecified injury of neck, initial encounter: Secondary | ICD-10-CM | POA: Diagnosis not present

## 2019-07-19 DIAGNOSIS — R519 Headache, unspecified: Secondary | ICD-10-CM | POA: Diagnosis not present

## 2019-07-19 DIAGNOSIS — Z886 Allergy status to analgesic agent status: Secondary | ICD-10-CM | POA: Diagnosis not present

## 2019-07-19 DIAGNOSIS — Z882 Allergy status to sulfonamides status: Secondary | ICD-10-CM | POA: Diagnosis not present

## 2019-07-21 ENCOUNTER — Ambulatory Visit (INDEPENDENT_AMBULATORY_CARE_PROVIDER_SITE_OTHER): Payer: Medicare HMO | Admitting: Family Medicine

## 2019-07-21 ENCOUNTER — Other Ambulatory Visit: Payer: Self-pay

## 2019-07-21 ENCOUNTER — Encounter: Payer: Self-pay | Admitting: Family Medicine

## 2019-07-21 VITALS — HR 87 | Temp 97.8°F | Ht 60.0 in

## 2019-07-21 DIAGNOSIS — S52502A Unspecified fracture of the lower end of left radius, initial encounter for closed fracture: Secondary | ICD-10-CM | POA: Diagnosis not present

## 2019-07-21 NOTE — Progress Notes (Signed)
Virtual Visit via Video Note  I connected with Tanya Griffith on 07/21/19 at  3:20 PM EST by a video enabled telemedicine application and verified that I am speaking with the correct person using two identifiers.  Location: Patient: home  Provider: office    I discussed the limitations of evaluation and management by telemedicine and the availability of in person appointments. The patient expressed understanding and agreed to proceed.  History of Present Illness: Pt fell and broke her wrist over the weekend  She needs a hand surgeon appointment   Observations/Objective: Vitals:   07/21/19 1522  Pulse: 87  Temp: 97.8 F (36.6 C)  SpO2: 96%   Pt is in a splint  Pt is in NAD Assessment and Plan: 1. Closed fracture of distal end of left radius, unspecified fracture morphology, initial encounter  - Ambulatory referral to Orthopedic Surgery   Follow Up Instructions:    I discussed the assessment and treatment plan with the patient. The patient was provided an opportunity to ask questions and all were answered. The patient agreed with the plan and demonstrated an understanding of the instructions.   The patient was advised to call back or seek an in-person evaluation if the symptoms worsen or if the condition fails to improve as anticipated.  I provided 15 minutes of non-face-to-face time during this encounter.   Ann Held, DO

## 2019-07-24 DIAGNOSIS — S52502A Unspecified fracture of the lower end of left radius, initial encounter for closed fracture: Secondary | ICD-10-CM | POA: Diagnosis not present

## 2019-07-24 DIAGNOSIS — M25532 Pain in left wrist: Secondary | ICD-10-CM | POA: Diagnosis not present

## 2019-07-24 DIAGNOSIS — M25552 Pain in left hip: Secondary | ICD-10-CM | POA: Diagnosis not present

## 2019-07-24 DIAGNOSIS — M25559 Pain in unspecified hip: Secondary | ICD-10-CM | POA: Diagnosis not present

## 2019-07-25 DIAGNOSIS — M25551 Pain in right hip: Secondary | ICD-10-CM | POA: Diagnosis not present

## 2019-07-25 DIAGNOSIS — M25559 Pain in unspecified hip: Secondary | ICD-10-CM | POA: Diagnosis not present

## 2019-08-12 ENCOUNTER — Telehealth: Payer: Self-pay

## 2019-08-12 ENCOUNTER — Other Ambulatory Visit: Payer: Self-pay

## 2019-08-12 DIAGNOSIS — Z13820 Encounter for screening for osteoporosis: Secondary | ICD-10-CM

## 2019-08-12 DIAGNOSIS — Z1231 Encounter for screening mammogram for malignant neoplasm of breast: Secondary | ICD-10-CM

## 2019-08-12 NOTE — Telephone Encounter (Signed)
Copied from [place] [phone]. Topic: General - Other >> Aug 11, 2019  2:04 PM Rainey Pines A wrote: Patient would a callback from nurse to scheduling a mammogram and bone density exam. Please advise

## 2019-08-12 NOTE — Telephone Encounter (Signed)
Copied from [place] [phone]. Topic: General - Other >> Aug 11, 2019  2:04 PM Rainey Pines A wrote: Patient would a callback from nurse to scheduling a mammogram and bone density exam. Please advise

## 2019-08-12 NOTE — Telephone Encounter (Signed)
That is fine 

## 2019-08-12 NOTE — Telephone Encounter (Signed)
Okay to place orders 

## 2019-08-12 NOTE — Telephone Encounter (Signed)
Orders placed. Pt notified.

## 2019-08-13 DIAGNOSIS — S52502D Unspecified fracture of the lower end of left radius, subsequent encounter for closed fracture with routine healing: Secondary | ICD-10-CM | POA: Diagnosis not present

## 2019-08-13 DIAGNOSIS — M25559 Pain in unspecified hip: Secondary | ICD-10-CM | POA: Diagnosis not present

## 2019-08-13 DIAGNOSIS — M25532 Pain in left wrist: Secondary | ICD-10-CM | POA: Diagnosis not present

## 2019-08-25 ENCOUNTER — Telehealth: Payer: Self-pay | Admitting: Gastroenterology

## 2019-08-25 ENCOUNTER — Encounter: Payer: Self-pay | Admitting: Family Medicine

## 2019-08-25 NOTE — Telephone Encounter (Signed)
I would think it would be ok for her to hold off if she prefers but I would have her call those drs and make sure its ok with them or is it possible to do a virtual visit with them?

## 2019-08-25 NOTE — Telephone Encounter (Signed)
I prefer in office visit so I can examine patient given her c/o weight loss.

## 2019-08-27 ENCOUNTER — Encounter: Payer: Self-pay | Admitting: Gastroenterology

## 2019-08-27 ENCOUNTER — Ambulatory Visit: Payer: Medicare HMO | Admitting: Gastroenterology

## 2019-08-27 ENCOUNTER — Other Ambulatory Visit: Payer: Medicare HMO

## 2019-08-27 VITALS — BP 140/80 | HR 64 | Temp 97.8°F | Ht 60.0 in | Wt 149.6 lb

## 2019-08-27 DIAGNOSIS — Z8719 Personal history of other diseases of the digestive system: Secondary | ICD-10-CM | POA: Diagnosis not present

## 2019-08-27 DIAGNOSIS — Z01818 Encounter for other preprocedural examination: Secondary | ICD-10-CM

## 2019-08-27 DIAGNOSIS — R197 Diarrhea, unspecified: Secondary | ICD-10-CM | POA: Diagnosis not present

## 2019-08-27 DIAGNOSIS — Z8711 Personal history of peptic ulcer disease: Secondary | ICD-10-CM

## 2019-08-27 DIAGNOSIS — R634 Abnormal weight loss: Secondary | ICD-10-CM

## 2019-08-27 MED ORDER — NA SULFATE-K SULFATE-MG SULF 17.5-3.13-1.6 GM/177ML PO SOLN: 1.0000 | ORAL | 0 refills | Status: DC

## 2019-08-27 NOTE — Progress Notes (Signed)
Tanya Griffith    MR:[id number]    1956/01/29  Primary Care Physician:Lowne Chase, Alferd Apa, DO  Referring Physician: Carollee Herter, Alferd Apa, DO [address] [place],  [place] [address]   Chief complaint: Weight loss, diarrhea  HPI: 64 year old female with history of interstitial cystitis here with complaints of worsening diarrhea associated with decreased appetite and weight loss. She noticed a chest wall mass in June 2020, diagnosed as elastofibroma.  She had to have MRI chest for diagnostic work-up, she feels diarrhea started after MRI with contrast.  She lost her appetite and lost about 12 pounds in the past 6 months.  Her appetite has improved in the past month or so and she is slowly regaining some of the weight. She has on average 1-4 semiformed to liquid bowel movements per day.  She usually does not wake up in the night but she has fecal urgency early in the morning and has to rush to the bathroom.  Denies any nausea or vomiting.  No mucus or blood in stool. No family history of colon cancer. She is on multiple supplements and herbal remedies as per her medication list.  None of them are new and she has been on them for past few years.  Cologuard Negative 09/2016   Outpatient Encounter Medications as of 08/27/2019  Medication Sig  . aspirin 325 MG tablet Take 325 mg by mouth daily.  . B Complex-C-Folic Acid (STRESS B COMPLEX PO) Take by mouth.  Marland Kitchen BIOTIN PO Take 10,000 mcg by mouth daily.   . Calcium-Magnesium (CAL-MAG PO) Take 3 tablets by mouth. Ca 1500 and mag 1000mg   . erythromycin ophthalmic ointment Place 1 application into both eyes at bedtime.  . Flaxseed, Linseed, (FLAX SEED OIL) 1000 MG CAPS Take by mouth.  . fluticasone (FLONASE) 50 MCG/ACT nasal spray Place 2 sprays into both nostrils daily.  . Methylsulfonylmethane (MSM) 1000 MG TABS Take by mouth. 5 mg With glucosasmine hcl 1500mg   . Multiple Vitamins-Minerals (CENTRUM SILVER PO) Take  by mouth.  . Omega-3 Fatty Acids (FISH OIL) 1000 MG CAPS Take by mouth.  . Probiotic Product (PROBIOTIC ADVANCED PO) Take by mouth.  . [DISCONTINUED] meclizine (ANTIVERT) 25 MG tablet Take 1 tablet (25 mg total) by mouth 3 (three) times daily as needed for dizziness.   No facility-administered encounter medications on file as of 08/27/2019.    Allergies as of 08/27/2019 - Review Complete 08/27/2019  Allergen Reaction Noted  . Other Other (See Comments) 03/05/2013  . Sulfa antibiotics  04/21/2014    Past Medical History:  Diagnosis Date  . 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 Back problem   . Chicken pox   . Environmental allergies   . IC (interstitial cystitis)   . Interstitial cystitis   . Mitral valve prolapse   . Osteoarthritis (arthritis due to wear and tear of joints)     Past Surgical History:  Procedure Laterality Date  . ABDOMINAL HYSTERECTOMY    . BLADDER SURGERY    . BLADDER SURGERY    . CESAREAN SECTION    . KNEE SURGERY Right   . MASTECTOMY Bilateral    jan 2018  . NASAL SEPTUM SURGERY    . OOPHORECTOMY    . TONSILLECTOMY      Family History  Problem Relation Age of Onset  . Dementia Mother        Vascular Dementia  . Alzheimer's disease  Mother   . Arthritis Mother   . Hyperlipidemia Mother   . Transient ischemic attack Mother   . Kidney disease Mother 40       Kidney Failure  . Cancer Sister        skin  . Arthritis Brother   . Arthritis Maternal Grandfather   . Arthritis Maternal Aunt   . Heart attack Paternal Grandfather   . Transient ischemic attack Maternal Aunt   . Hypertension Brother   . Diabetes Maternal Aunt   . Diabetes Brother        Medication induced  . Breast cancer Other        Niece--Double Mastectomy    Social History   Socioeconomic History  . Marital status: Married    Spouse name: Not on file  . Number of children: 1  . Years of education: Not on file  . Highest education level: Not on file  Occupational History  .  Occupation: unemployed  Tobacco Use  . Smoking status: Never Smoker  . Smokeless tobacco: Never Used  Substance and Sexual Activity  . Alcohol use: No  . Drug use: No  . Sexual activity: Not Currently  Other Topics Concern  . Not on file  Social History Narrative   Exercise-walks   Social Determinants of Health   Financial Resource Strain:   . Difficulty of Paying Living Expenses: Not on file  Food Insecurity:   . Worried About Charity fundraiser in the Last Year: Not on file  . Ran Out of Food in the Last Year: Not on file  Transportation Needs:   . Lack of Transportation (Medical): Not on file  . Lack of Transportation (Non-Medical): Not on file  Physical Activity:   . Days of Exercise per Week: Not on file  . Minutes of Exercise per Session: Not on file  Stress:   . Feeling of Stress : Not on file  Social Connections:   . Frequency of Communication with Friends and Family: Not on file  . Frequency of Social Gatherings with Friends and Family: Not on file  . Attends Religious Services: Not on file  . Active Member of Clubs or Organizations: Not on file  . Attends Archivist Meetings: Not on file  . Marital Status: Not on file  Intimate Partner Violence:   . Fear of Current or Ex-Partner: Not on file  . Emotionally Abused: Not on file  . Physically Abused: Not on file  . Sexually Abused: Not on file      Review of systems: Review of Systems  Constitutional: Negative for fever and chills.  HENT: Negative.   Eyes: Negative for blurred vision.  Respiratory: Negative for cough, shortness of breath and wheezing.   Cardiovascular: Negative for chest pain and palpitations.  Gastrointestinal: as per HPI Genitourinary: Negative for dysuria, urgency, frequency and hematuria.  Musculoskeletal: Positive for myalgias, back pain and joint pain.  Skin: Negative for itching and rash.  Neurological: Negative for dizziness, tremors, focal weakness, seizures and loss  of consciousness.  Endo/Heme/Allergies: Negative Psychiatric/Behavioral: Negative for depression, suicidal ideas and hallucinations.  All other systems reviewed and are negative.   Physical Exam: Vitals:   08/27/19 1000  BP: 140/80  Pulse: 64  Temp: 97.8 F (36.6 C)   Body mass index is 29.22 kg/m. Gen:      No acute distress HEENT:  EOMI, sclera anicteric Neck:     No masses; no thyromegaly Lungs:    Clear to  auscultation bilaterally; normal respiratory effort CV:         Regular rate and rhythm; no murmurs Abd:      + bowel sounds; soft, non-tender; no palpable masses, no distension Ext:    No edema; adequate peripheral perfusion Skin:      Warm and dry; no rash Neuro: alert and oriented x 3 Psych: normal mood and affect  Data Reviewed:  Reviewed labs, radiology imaging, old records and pertinent past GI work up   Assessment and Plan/Recommendations:  64 year old female with history of chronic interstitial cystitis, 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  She had negative Cologuard in 2018  The risks and benefits as well as alternatives of endoscopic procedure(s) have been discussed and reviewed. All questions answered. The patient agrees to proceed.  Further recommendation based on work-up, follow-up after colonoscopy.  She has remote history of gastric ulcer in her 6s, will check H. pylori stool antigen Denies excessive use of NSAIDs, advised patient to continue to avoid them   The patient was provided an opportunity to  ask questions and all were answered. The patient agreed with the plan and demonstrated an understanding of the instructions.  Damaris Hippo , MD    CC: Carollee Herter, Alferd Apa, *

## 2019-08-27 NOTE — Patient Instructions (Addendum)
Stop Flax Seed Oil and Fish Oil   You have been scheduled for a colonoscopy. Please follow written instructions given to you at your visit today.  Please pick up your prep supplies at the pharmacy within the next 1-3 days. If you use inhalers (even only as needed), please bring them with you on the day of your procedure.  Your provider has requested that you go to the basement level for lab work before leaving today. Press "B" on the elevator. The lab is located at the first door on the left as you exit the elevator.  If you are age 70 or older, your body mass index should be between 23-30. Your Body mass index is 29.22 kg/m. If this is out of the aforementioned range listed, please consider follow up with your Primary Care Provider.  If you are age 35 or younger, your body mass index should be between 19-25. Your Body mass index is 29.22 kg/m. If this is out of the aformentioned range listed, please consider follow up with your Primary Care Provider.   I value your feedback and thank you for entrusting [place] with your care. If you get a [facility] patient survey, I would appreciate you taking the time to let us know about your experience today. Thank you!   Due to recent changes in healthcare laws, you may see the results of your imaging and laboratory studies on MyChart before your provider has had a chance to review them.  We understand that in some cases there may be results that are confusing or concerning to you. Not all laboratory results come back in the same time frame and the provider may be waiting for multiple results in order to interpret others.  Please give [place] 48 hours in order for your provider to thoroughly review all the results before contacting the office for clarification of your results.

## 2019-09-01 ENCOUNTER — Other Ambulatory Visit: Payer: Medicare HMO

## 2019-09-01 DIAGNOSIS — R197 Diarrhea, unspecified: Secondary | ICD-10-CM | POA: Diagnosis not present

## 2019-09-01 DIAGNOSIS — R634 Abnormal weight loss: Secondary | ICD-10-CM

## 2019-09-01 DIAGNOSIS — Z8711 Personal history of peptic ulcer disease: Secondary | ICD-10-CM

## 2019-09-01 DIAGNOSIS — Z8719 Personal history of other diseases of the digestive system: Secondary | ICD-10-CM

## 2019-09-02 ENCOUNTER — Other Ambulatory Visit: Payer: Medicare HMO

## 2019-09-03 ENCOUNTER — Other Ambulatory Visit: Payer: Self-pay | Admitting: Gastroenterology

## 2019-09-03 ENCOUNTER — Ambulatory Visit (INDEPENDENT_AMBULATORY_CARE_PROVIDER_SITE_OTHER): Payer: Medicare HMO

## 2019-09-03 DIAGNOSIS — M25532 Pain in left wrist: Secondary | ICD-10-CM | POA: Diagnosis not present

## 2019-09-03 DIAGNOSIS — S52502D Unspecified fracture of the lower end of left radius, subsequent encounter for closed fracture with routine healing: Secondary | ICD-10-CM | POA: Diagnosis not present

## 2019-09-03 DIAGNOSIS — M25632 Stiffness of left wrist, not elsewhere classified: Secondary | ICD-10-CM | POA: Diagnosis not present

## 2019-09-03 DIAGNOSIS — Z1159 Encounter for screening for other viral diseases: Secondary | ICD-10-CM

## 2019-09-04 LAB — SARS CORONAVIRUS 2 (TAT 6-24 HRS): SARS Coronavirus 2: NEGATIVE

## 2019-09-05 ENCOUNTER — Ambulatory Visit (AMBULATORY_SURGERY_CENTER): Payer: Medicare HMO | Admitting: Gastroenterology

## 2019-09-05 ENCOUNTER — Encounter: Payer: Self-pay | Admitting: Gastroenterology

## 2019-09-05 ENCOUNTER — Other Ambulatory Visit: Payer: Self-pay | Admitting: Gastroenterology

## 2019-09-05 ENCOUNTER — Other Ambulatory Visit: Payer: Self-pay

## 2019-09-05 VITALS — BP 127/62 | HR 64 | Temp 97.3°F | Resp 16 | Ht 60.0 in | Wt 149.0 lb

## 2019-09-05 DIAGNOSIS — K5730 Diverticulosis of large intestine without perforation or abscess without bleeding: Secondary | ICD-10-CM

## 2019-09-05 DIAGNOSIS — R197 Diarrhea, unspecified: Secondary | ICD-10-CM

## 2019-09-05 DIAGNOSIS — K52831 Collagenous colitis: Secondary | ICD-10-CM | POA: Diagnosis not present

## 2019-09-05 DIAGNOSIS — K648 Other hemorrhoids: Secondary | ICD-10-CM

## 2019-09-05 DIAGNOSIS — R634 Abnormal weight loss: Secondary | ICD-10-CM

## 2019-09-05 HISTORY — PX: COLONOSCOPY: SHX174

## 2019-09-05 MED ORDER — SODIUM CHLORIDE 0.9 % IV SOLN: 500.0000 mL | Freq: Once | INTRAVENOUS | Status: DC

## 2019-09-05 NOTE — Progress Notes (Signed)
Called to room to assist during endoscopic procedure.  Patient ID and intended procedure confirmed with present staff. Received instructions for my participation in the procedure from the performing physician.  

## 2019-09-05 NOTE — Progress Notes (Signed)
Temp check by:LC Vital check by:CW  The medical and surgical history was reviewed and verified with the patient.

## 2019-09-05 NOTE — Op Note (Signed)
[facility] Patient Name: Tanya Griffith Procedure Date: 09/05/2019 11:12 AM MRN: [id number] Endoscopist: Mauri Pole , MD Age: 64 Referring MD:  Date of Birth: 1956/02/14 Gender: Female Account #: [id number] Procedure:                Colonoscopy Indications:              Clinically significant diarrhea of unexplained                            origin Medicines:                Monitored Anesthesia Care Procedure:                Pre-Anesthesia Assessment:                           - Prior to the procedure, a History and Physical                            was performed, and patient medications and                            allergies were reviewed. The patient's tolerance of                            previous anesthesia was also reviewed. The risks                            and benefits of the procedure and the sedation                            options and risks were discussed with the patient.                            All questions were answered, and informed consent                            was obtained. Prior Anticoagulants: The patient has                            taken no previous anticoagulant or antiplatelet                            agents. ASA Grade Assessment: II - A patient with                            mild systemic disease. After reviewing the risks                            and benefits, the patient was deemed in                            satisfactory condition to undergo the procedure.  After obtaining informed consent, the colonoscope                            was passed under direct vision. Throughout the                            procedure, the patient's blood pressure, pulse, and                            oxygen saturations were monitored continuously. The                            Colonoscope was introduced through the anus and                            advanced to the the cecum, identified by                      appendiceal orifice and ileocecal valve. The                            colonoscopy was performed without difficulty. The                            patient tolerated the procedure well. The quality                            of the bowel preparation was excellent. The                            ileocecal valve, appendiceal orifice, and rectum                            were photographed. Scope In: 11:17:18 AM Scope Out: 11:34:30 AM Scope Withdrawal Time: 0 hours 8 minutes 48 seconds  Total Procedure Duration: 0 hours 17 minutes 12 seconds  Findings:                 The perianal and digital rectal examinations were                            normal.                           Multiple small and large-mouthed diverticula were                            found in the sigmoid colon and descending colon.                           Non-bleeding internal hemorrhoids were found during                            retroflexion. The hemorrhoids were small.  The colon (entire examined portion) appeared                            normal. Biopsies for histology were taken with a                            cold forceps from the right colon and left colon                            for evaluation of microscopic colitis. Complications:            No immediate complications. Estimated Blood Loss:     Estimated blood loss was minimal. Impression:               - Moderate diverticulosis in the sigmoid colon and                            in the descending colon.                           - Non-bleeding internal hemorrhoids.                           - The entire examined colon is normal. Biopsied. Recommendation:           - Patient has a contact number available for                            emergencies. The signs and symptoms of potential                            delayed complications were discussed with the                            patient. Return to normal  activities tomorrow.                            Written discharge instructions were provided to the                            patient.                           - Resume previous diet.                           - Continue present medications.                           - Await pathology results.                           - Repeat colonoscopy in 10 years for screening                            purposes. Mauri Pole, MD 09/05/2019 11:39:04 AM This report has been signed  electronically.

## 2019-09-05 NOTE — Patient Instructions (Signed)
Please read handouts provided. Continue present medications. Await pathology results.        YOU HAD AN ENDOSCOPIC PROCEDURE TODAY AT THE [facility]:   Refer to the procedure report that was given to you for any specific questions about what was found during the examination.  If the procedure report does not answer your questions, please call your gastroenterologist to clarify.  If you requested that your care partner not be given the details of your procedure findings, then the procedure report has been included in a sealed envelope for you to review at your convenience later.  YOU SHOULD EXPECT: Some feelings of bloating in the abdomen. Passage of more gas than usual.  Walking can help get rid of the air that was put into your GI tract during the procedure and reduce the bloating. If you had a lower endoscopy (such as a colonoscopy or flexible sigmoidoscopy) you may notice spotting of blood in your stool or on the toilet paper. If you underwent a bowel prep for your procedure, you may not have a normal bowel movement for a few days.  Please Note:  You might notice some irritation and congestion in your nose or some drainage.  This is from the oxygen used during your procedure.  There is no need for concern and it should clear up in a day or so.  SYMPTOMS TO REPORT IMMEDIATELY:   Following lower endoscopy (colonoscopy or flexible sigmoidoscopy):  Excessive amounts of blood in the stool  Significant tenderness or worsening of abdominal pains  Swelling of the abdomen that is new, acute  Fever of 100F or higher    For urgent or emergent issues, a gastroenterologist can be reached at any hour by calling [phone].   DIET:  We do recommend a small meal at first, but then you may proceed to your regular diet.  Drink plenty of fluids but you should avoid alcoholic beverages for 24 hours.  ACTIVITY:  You should plan to take it easy for the rest of today and you should NOT  DRIVE or use heavy machinery until tomorrow (because of the sedation medicines used during the test).    FOLLOW UP: Our staff will call the number listed on your records 48-72 hours following your procedure to check on you and address any questions or concerns that you may have regarding the information given to you following your procedure. If we do not reach you, we will leave a message.  We will attempt to reach you two times.  During this call, we will ask if you have developed any symptoms of COVID 19. If you develop any symptoms (ie: fever, flu-like symptoms, shortness of breath, cough etc.) before then, please call [phone].  If you test positive for Covid 19 in the 2 weeks post procedure, please call and report this information to us.    If any biopsies were taken you will be contacted by phone or by letter within the next 1-3 weeks.  Please call us at [phone] if you have not heard about the biopsies in 3 weeks.    SIGNATURES/CONFIDENTIALITY: You and/or your care partner have signed paperwork which will be entered into your electronic medical record.  These signatures attest to the fact that that the information above on your After Visit Summary has been reviewed and is understood.  Full responsibility of the confidentiality of this discharge information lies with you and/or your care-partner. 

## 2019-09-08 LAB — FECAL FAT, QUALITATIVE
Fat Qual Neutral, Stl: NORMAL
Fat Qual Total, Stl: NORMAL

## 2019-09-09 ENCOUNTER — Telehealth: Payer: Self-pay | Admitting: *Deleted

## 2019-09-09 ENCOUNTER — Telehealth: Payer: Self-pay

## 2019-09-09 NOTE — Telephone Encounter (Signed)
No answer for post procedure call back. Left message and will attempt to call back this afternoon. SM

## 2019-09-09 NOTE — Telephone Encounter (Signed)
  Follow up Call-  Call back number 09/05/2019  Post procedure Call Back phone  # [phone]  Permission to leave phone message Yes  Some recent data might be hidden     Patient questions:  Do you have a fever, pain , or abdominal swelling? No. Pain Score  0 *  Have you tolerated food without any problems? Yes.    Have you been able to return to your normal activities? Yes.    Do you have any questions about your discharge instructions: Diet   No. Medications  No. Follow up visit  No.  Do you have questions or concerns about your Care? No.  Actions: * If pain score is 4 or above: No action needed, pain <4  1. Have you developed a fever since your procedure? no  2.   Have you had an respiratory symptoms (SOB or cough) since your procedure? no  3.   Have you tested positive for COVID 19 since your procedure no  4.   Have you had any family members/close contacts diagnosed with the COVID 19 since your procedure?  no   If yes to any of these questions please route to Joylene John, RN and Alphonsa Gin, Therapist, sports.

## 2019-09-11 ENCOUNTER — Telehealth: Payer: Self-pay | Admitting: Gastroenterology

## 2019-09-11 NOTE — Telephone Encounter (Signed)
Pt inquired about pathology results.

## 2019-09-12 NOTE — Telephone Encounter (Signed)
Please see the pathology results for details. Called the patient back. No answer. Left message to call back.

## 2019-09-15 ENCOUNTER — Other Ambulatory Visit: Payer: Self-pay

## 2019-09-15 ENCOUNTER — Other Ambulatory Visit: Payer: Medicare HMO

## 2019-09-15 ENCOUNTER — Telehealth: Payer: Self-pay

## 2019-09-15 MED ORDER — BUDESONIDE ER 9 MG PO TB24: 9.0000 mg | ORAL_TABLET | Freq: Every day | ORAL | 2 refills | Status: AC

## 2019-09-15 NOTE — Telephone Encounter (Signed)
PA started for Uceris. Needing to start treatment for K52. 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 Submitted through Cover My Meds.

## 2019-09-16 NOTE — Telephone Encounter (Signed)
Your information has been submitted to [employer]. [employer] will review the request and will issue a decision, typically within 3-7 days from your submission. You can check the updated outcome later by reopening this request.  If [employer] has not responded in 3-7 days or if you have any questions about your ePA request, please contact [employer] at [phone]. If you think there may be a problem with your PA request, use our live chat feature at the bottom right.

## 2019-09-16 NOTE — Telephone Encounter (Signed)
Received documentation that Budesonide was approved until 08/06/2020   Sent in to be scanned in

## 2019-09-17 ENCOUNTER — Telehealth: Payer: Self-pay | Admitting: Gastroenterology

## 2019-09-17 DIAGNOSIS — M25632 Stiffness of left wrist, not elsewhere classified: Secondary | ICD-10-CM | POA: Diagnosis not present

## 2019-09-17 NOTE — Telephone Encounter (Signed)
Pt stated that she has decided to not take budesonide.  She reported that she has not reacted well to steroids in the past.  Pt is scheduled with a nutritionist December 03, 2019.  She reported that she has altered her diet and it has helped.

## 2019-09-18 LAB — GASTROINTESTINAL PATHOGEN PANEL PCR
C. difficile Tox A/B, PCR: NOT DETECTED
Campylobacter, PCR: NOT DETECTED
Cryptosporidium, PCR: NOT DETECTED
E coli (ETEC) LT/ST PCR: NOT DETECTED
E coli (STEC) stx1/stx2, PCR: NOT DETECTED
E coli 0157, PCR: NOT DETECTED
Giardia lamblia, PCR: NOT DETECTED
Norovirus, PCR: NOT DETECTED
Rotavirus A, PCR: NOT DETECTED
Salmonella, PCR: NOT DETECTED
Shigella, PCR: NOT DETECTED

## 2019-09-18 LAB — HELICOBACTER PYLORI  SPECIAL ANTIGEN
MICRO NUMBER:: 10076783
SPECIMEN QUALITY: ADEQUATE

## 2019-09-18 LAB — PANCREATIC ELASTASE, FECAL: Pancreatic Elastase-1, Stool: >500 mcg/g

## 2019-09-18 NOTE — Telephone Encounter (Signed)
Ok, please schedule office visit next available appt. Thanks

## 2019-09-18 NOTE — Telephone Encounter (Signed)
She is scheduled for 10/28/19.

## 2019-10-10 ENCOUNTER — Ambulatory Visit: Payer: Medicare HMO | Attending: Internal Medicine

## 2019-10-10 DIAGNOSIS — Z23 Encounter for immunization: Secondary | ICD-10-CM | POA: Insufficient documentation

## 2019-10-10 NOTE — Progress Notes (Signed)
   Covid-19 Vaccination Clinic  Name:  Tanya Griffith    MRN: [id number] DOB: [birth year]  10/10/2019  Tanya Griffith was observed post Covid-19 immunization for 15 minutes without incident. She was provided with Vaccine Information Sheet and instruction to access the V-Safe system.   Tanya Griffith was instructed to call 911 with any severe reactions post vaccine: Marland Kitchen Difficulty breathing  . Swelling of face and throat  . A fast heartbeat  . A bad rash all over body  . Dizziness and weakness   Immunizations Administered    Name Date Dose VIS Date Route   Pfizer COVID-19 Vaccine 10/10/2019 11:57 AM 0.3 mL 07/18/2019 Intramuscular   Manufacturer: [place]   Lot: [id number]   [place]: [id number]

## 2019-10-15 ENCOUNTER — Other Ambulatory Visit: Payer: Medicare HMO

## 2019-10-28 ENCOUNTER — Encounter: Payer: Self-pay | Admitting: Gastroenterology

## 2019-10-28 ENCOUNTER — Ambulatory Visit: Payer: Medicare HMO | Admitting: Gastroenterology

## 2019-10-28 VITALS — BP 136/68 | HR 68 | Temp 98.3°F | Ht 60.0 in | Wt 143.8 lb

## 2019-10-28 DIAGNOSIS — K52831 Collagenous colitis: Secondary | ICD-10-CM | POA: Diagnosis not present

## 2019-10-28 NOTE — Patient Instructions (Addendum)
If you are age 65 or younger, your body mass index should be between 19-25. Your Body mass index is 28.08 kg/m. If this is out of the aformentioned range listed, please consider follow up with your Primary Care Provider.   Please start Uceris(Budesonide).   Please call office and schedule office follow-up after you have been on Uceris for 4-6 weeks.   Thank you for choosing me and [facility].  Dr. Silverio Decamp

## 2019-10-28 NOTE — Progress Notes (Signed)
Tanya Griffith    [id number]    1956/04/12  Primary Care Physician:Lowne Koren Shiver, DO  Referring Physician: Carollee Herter, Alferd Apa, DO [address] [place],  [place] [id number]   Chief complaint:  Collagenous colitis  HPI:  64 year old female with history of interstitial cystitis, chronic diarrhea, last office visit in January 2021 with worsening diarrhea, fecal urgency and incontinence associated with decreased appetite and weight loss  Colonoscopy September 05, 2019: Left-sided diverticulosis, internal hemorrhoids.  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  Patient was given prescription for Uceris but she hasnt filled it yet, concerned about potential side effects.  After informing patient the results of colonoscopy and biopsies, my RN  encouraged patient to start [Uceris] budesonide, was reassured that there are no systemic side effects.    She remains hesitant to start the medication.  She said she always has reaction to medications, does not trust doctors, her daughter had life-threatening issue after starting a migraine medication.   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  Patient kept saying that she is not stupid and she looked at reputable site when I questioned her  She is going to see an alternative medicine practitioner and is going to see if there are any alternative supplements or herbal remedies that will help her.  She also is going to see a nutritionist to discuss anti-inflammatory diet.  She is having 3-4 bowel movements on a good day to 7-8 liquid watery bowel movements on a bad day with nocturnal symptoms.  No blood in stool.  She received first dose of covid vaccination, feels diarrhea symptoms exacerbated  after that.  She is worried of potential immunosuppression with steroids, again explained to patient that budesonide does not cause systemic side effects or immunosuppression at that low-dose and is not going to interact with Covid vaccination   Other relevant GI work-up: September 01, 2019 Qualitative fecal fat normal GI pathogen panel negative H. pylori negative  Outpatient Encounter Medications as of 10/28/2019  Medication Sig  . aspirin 325 MG tablet Take 325 mg by mouth daily.  . B Complex-C-Folic Acid (STRESS B COMPLEX PO) Take by mouth.  Marland Kitchen BIOTIN PO Take 10,000 mcg by mouth daily.   . Budesonide ER (UCERIS) 9 MG TB24 Take 9 mg by mouth daily.  . Calcium-Magnesium (CAL-MAG PO) Take 3 tablets by mouth. Ca 1500 and mag 1000mg   . erythromycin ophthalmic ointment Place 1 application into both eyes at bedtime.  . Flaxseed, Linseed, (FLAX SEED OIL) 1000 MG CAPS Take by mouth.  . fluticasone (FLONASE) 50 MCG/ACT nasal spray Place 2 sprays into both nostrils daily.  . Methylsulfonylmethane (MSM) 1000 MG TABS Take by mouth. 5 mg With glucosasmine hcl 1500mg   . Multiple Vitamins-Minerals (CENTRUM SILVER PO) Take by mouth.  . Omega-3 Fatty Acids (FISH OIL) 1000 MG CAPS Take by mouth.  . Probiotic Product (PROBIOTIC ADVANCED PO) Take by mouth.   No facility-administered encounter medications on file as of 10/28/2019.    Allergies as of 10/28/2019 - Review Complete 09/05/2019  Allergen Reaction Noted  . Other Other (See Comments) 03/05/2013  . Sulfa antibiotics  04/21/2014    Past Medical History:  Diagnosis Date  . 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 Back problem   . Chicken pox   . Environmental allergies   . IC (interstitial cystitis)   . Interstitial cystitis   . Mitral valve prolapse   . Osteoarthritis (arthritis due to wear and tear of joints)     Past Surgical History:  Procedure Laterality Date  . ABDOMINAL HYSTERECTOMY    . BLADDER SURGERY    . BLADDER SURGERY    . CESAREAN  SECTION    . COLONOSCOPY  09/05/2019  . KNEE SURGERY Right   . NASAL SEPTUM SURGERY    . OOPHORECTOMY    . TONSILLECTOMY      Family History  Problem Relation Age of Onset  . Dementia Mother        Vascular Dementia  . Alzheimer's disease Mother   . Arthritis Mother   . Hyperlipidemia Mother   . Transient ischemic attack Mother   . Kidney disease Mother 20       Kidney Failure  . Cancer Sister        skin  . Arthritis Brother   . Arthritis Maternal Grandfather   . Arthritis Maternal Aunt   . Heart attack Paternal Grandfather   . Transient ischemic attack Maternal Aunt   . Hypertension Brother   . Diabetes Maternal Aunt   . Diabetes Brother        Medication induced  . Breast cancer Other        Niece--Double Mastectomy  . Esophageal cancer Other   . Pancreatic cancer Maternal Uncle   . Colon cancer Neg Hx   . Liver cancer Neg Hx   . Rectal cancer Neg Hx   . Stomach cancer Neg Hx     Social History   Socioeconomic History  . Marital status: Married    Spouse name: Not on file  . Number of children: 1  . Years of education: Not on file  . Highest education level: Not on file  Occupational History  . Occupation: unemployed  Tobacco Use  . Smoking status: Never Smoker  . Smokeless tobacco: Never Used  Substance and Sexual Activity  . Alcohol use: No  . Drug use: No  . Sexual activity: Not Currently  Other Topics Concern  . Not on file  Social History Narrative   Exercise-walks   Social Determinants of Health   Financial Resource Strain:   . Difficulty of Paying Living Expenses:   Food Insecurity:   . Worried About Charity fundraiser in the Last Year:   . Arboriculturist in the Last Year:   Transportation Needs:   . Film/video editor (Medical):   Marland Kitchen Lack of Transportation (Non-Medical):   Physical Activity:   . Days of Exercise per Week:   . Minutes of Exercise per Session:   Stress:   . Feeling of Stress :   Social Connections:   .  Frequency of Communication with Friends and Family:   . Frequency of Social Gatherings with Friends and Family:   . Attends Religious Services:   . Active Member of Clubs or Organizations:   . Attends Archivist Meetings:   Marland Kitchen Marital Status:   Intimate Partner Violence:   . Fear of Current or Ex-Partner:   . Emotionally Abused:   Marland Kitchen Physically Abused:   . Sexually Abused:       Review of systems: All other review of systems negative except as mentioned in the HPI.   Physical Exam: Vitals:  10/28/19 1349  BP: 136/68  Pulse: 68  Temp: 98.3 F (36.8 C)   Body mass index is 28.08 kg/m. 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   She is interested in seeking alternative therapies through herbal remedies or diet Also explained to patient that microscopic colitis is triggered by medications, NSAIDs or herbal supplements  If she changes her mind and decides to start taking budesonide, will recommend doing a course for 60 days with Uceris 9 mg daily and will reevaluate in 4 to 6 weeks to see if we need to extend therapy or not  Avoid NSAIDs as likely trigger for collagenous colitis, her symptoms started after she had a fall with abdominal muscle and ligament tear     This visit required 45 minutes of patient care (this includes precharting, chart review, review of results, face-to-face time used for counseling as well as treatment plan and follow-up.  The patient was provided an opportunity to ask questions and all were answered. The patient agreed with the plan and demonstrated an understanding of the instructions.  Damaris Hippo , MD    CC: Carollee Herter, Alferd Apa, *

## 2019-10-31 ENCOUNTER — Ambulatory Visit: Payer: Medicare HMO | Attending: Internal Medicine

## 2019-10-31 DIAGNOSIS — Z23 Encounter for immunization: Secondary | ICD-10-CM

## 2019-10-31 NOTE — Progress Notes (Signed)
   Covid-19 Vaccination Clinic  Name:  Tanya Griffith    MRN: [id number] DOB: Jun 25, 1956  10/31/2019  Tanya Griffith was observed post Covid-19 immunization for 15 minutes without incident. She was provided with Vaccine Information Sheet and instruction to access the V-Safe system.   Tanya Griffith was instructed to call 911 with any severe reactions post vaccine: Marland Kitchen Difficulty breathing  . Swelling of face and throat  . A fast heartbeat  . A bad rash all over body  . Dizziness and weakness   Immunizations Administered    Name Date Dose VIS Date Route   Pfizer COVID-19 Vaccine 10/31/2019 10:12 AM 0.3 mL 07/18/2019 Intramuscular   Manufacturer: [employer]   Lot: [id number]   [place]: [id number]

## 2019-11-03 NOTE — Progress Notes (Signed)
Nurse connected with patient 11/04/19 at  1:00 PM EDT by a telephone enabled telemedicine application and verified that I am speaking with the correct person using two identifiers. Patient stated full name and DOB. Patient gave permission to continue with virtual visit. Patient's location was at home and Nurse's location was at [place] office.   Subjective:   Tanya Griffith is a 64 y.o. female who presents for Medicare Annual (Subsequent) preventive examination.  Review of Systems:  Home Safety/Smoke Alarms: Feels safe in home. Smoke alarms in place.  Lives w/ husband in 2 story home. Does well w/ stairs.    Female:   Pap-  Hx total hysterectomy     Mammo- scheduled 12/19/19       Dexa scan- scheduled 11/10/19        CCS- 09/05/19. Recall 10 yrs.     Objective:     Vitals: Unable to assess. This visit is enabled though telemedicine due to Covid 19.   Advanced Directives 11/04/2019 06/21/2018 11/13/2016  Does Patient Have a Medical Advance Directive? No Yes No  Type of Advance Directive - [place];Living will -  Copy of [place] in Chart? - No - copy requested -  Would patient like information on creating a medical advance directive? No - Patient declined - Yes (MAU/Ambulatory/Procedural Areas - Information given)    Tobacco Social History   Tobacco Use  Smoking Status Never Smoker  Smokeless Tobacco Never Used     Counseling given: Not Answered   Clinical Intake: Pain : No/denies pain      Past Medical History:  Diagnosis Date  . Abdominal wall ulcer ([place])   . Back problem   . Chicken pox   . Environmental allergies   . IC (interstitial cystitis)   . Interstitial cystitis   . Mitral valve prolapse   . Osteoarthritis (arthritis due to wear and tear of joints)    Past Surgical History:  Procedure Laterality Date  . ABDOMINAL HYSTERECTOMY    . BLADDER SURGERY    . BLADDER SURGERY    . CESAREAN SECTION    . COLONOSCOPY   09/05/2019  . KNEE SURGERY Right   . NASAL SEPTUM SURGERY    . OOPHORECTOMY    . TONSILLECTOMY     Family History  Problem Relation Age of Onset  . Dementia Mother        Vascular Dementia  . Alzheimer's disease Mother   . Arthritis Mother   . Hyperlipidemia Mother   . Transient ischemic attack Mother   . Kidney disease Mother 44       Kidney Failure  . Cancer Sister        skin  . Arthritis Brother   . Arthritis Maternal Grandfather   . Arthritis Maternal Aunt   . Heart attack Paternal Grandfather   . Transient ischemic attack Maternal Aunt   . Hypertension Brother   . Diabetes Maternal Aunt   . Diabetes Brother        Medication induced  . Breast cancer Other        Niece--Double Mastectomy  . Esophageal cancer Other   . Pancreatic cancer Maternal Uncle   . Colon cancer Neg Hx   . Liver cancer Neg Hx   . Rectal cancer Neg Hx   . Stomach cancer Neg Hx    Social History   Socioeconomic History  . Marital status: Married    Spouse name: Not on file  .  Number of children: 1  . Years of education: Not on file  . Highest education level: Not on file  Occupational History  . Occupation: unemployed  Tobacco Use  . Smoking status: Never Smoker  . Smokeless tobacco: Never Used  Substance and Sexual Activity  . Alcohol use: No  . Drug use: No  . Sexual activity: Not Currently  Other Topics Concern  . Not on file  Social History Narrative   Exercise-walks   Social Determinants of Health   Financial Resource Strain: Low Risk   . Difficulty of Paying Living Expenses: Not hard at all  Food Insecurity: No Food Insecurity  . Worried About Charity fundraiser in the Last Year: Never true  . Ran Out of Food in the Last Year: Never true  Transportation Needs: No Transportation Needs  . Lack of Transportation (Medical): No  . Lack of Transportation (Non-Medical): No  Physical Activity:   . Days of Exercise per Week:   . Minutes of Exercise per Session:   Stress:     . Feeling of Stress :   Social Connections:   . Frequency of Communication with Friends and Family:   . Frequency of Social Gatherings with Friends and Family:   . Attends Religious Services:   . Active Member of Clubs or Organizations:   . Attends Archivist Meetings:   Marland Kitchen Marital Status:     Outpatient Encounter Medications as of 11/04/2019  Medication Sig  . aspirin 325 MG tablet Take 325 mg by mouth daily.  . B Complex-C-Folic Acid (STRESS B COMPLEX PO) Take by mouth.  Marland Kitchen BIOTIN PO Take 10,000 mcg by mouth daily.   . Budesonide ER (UCERIS) 9 MG TB24 Take 9 mg by mouth daily. (Patient not taking: Reported on 11/04/2019)  . Calcium-Magnesium (CAL-MAG PO) Take 3 tablets by mouth. Ca 1500 and mag 1000mg   . Flaxseed, Linseed, (FLAX SEED OIL) 1000 MG CAPS Take by mouth.  . Methylsulfonylmethane (MSM) 1000 MG TABS Take by mouth. 5 mg With glucosasmine hcl 1500mg   . Multiple Vitamins-Minerals (CENTRUM SILVER PO) Take by mouth.  . Omega-3 Fatty Acids (FISH OIL) 1000 MG CAPS Take by mouth.  . Probiotic Product (PROBIOTIC ADVANCED PO) Take by mouth.   No facility-administered encounter medications on file as of 11/04/2019.    Activities of Daily Living In your present state of health, do you have any difficulty performing the following activities: 11/04/2019  Hearing? N  Vision? N  Difficulty concentrating or making decisions? N  Walking or climbing stairs? N  Dressing or bathing? N  Doing errands, shopping? N  Preparing Food and eating ? N  Using the Toilet? N  In the past six months, have you accidently leaked urine? N  Do you have problems with loss of bowel control? N  Managing your Medications? N  Managing your Finances? N  Housekeeping or managing your Housekeeping? N  Some recent data might be hidden    Patient Care Team: Carollee Herter, Alferd Apa, DO as PCP - General (Family Medicine) [place], [place] Eduardo Osier., MD as Attending Physician (Urology)    Assessment:    This is a routine wellness examination for [place]. Physical assessment deferred to PCP.   Exercise Activities and Dietary recommendations Current Exercise Habits: Home exercise routine, Type of exercise: walking, Time (Minutes): 30, Frequency (Times/Week): 7, Weekly Exercise (Minutes/Week): 210, Intensity: Mild, Exercise limited by: None identified Diet (meal preparation, eat out, water intake, caffeinated beverages, dairy products, fruits and  vegetables): well balanced   Goals    . Lose weight through exercise.    . To be more positive       Fall Risk Fall Risk  11/04/2019 06/21/2018 11/13/2016  Falls in the past year? 1 1 Yes  Number falls in past yr: 0 0 1  Injury with Fall? 1 0 Yes  Comment - - pt report she tripped over a root sticking up from the ground and hit her right shoulder.  Follow up Education provided;Falls prevention discussed - -   Depression Screen PHQ 2/9 Scores 11/04/2019 06/25/2018 06/21/2018 11/13/2016  PHQ - 2 Score 0 0 0 0  PHQ- 9 Score - 6 - -     Cognitive Function Ad8 score reviewed for issues:  Issues making decisions:no  Less interest in hobbies / activities:no  Repeats questions, stories (family complaining):no  Trouble using ordinary gadgets (microwave, computer, phone):no  Forgets the month or year: no  Mismanaging finances: no  Remembering appts:no  Daily problems with thinking and/or memory:no Ad8 score is=0   MMSE - Mini Mental State Exam 11/13/2016  Orientation to time 5  Orientation to Place 5  Registration 3  Attention/ Calculation 5  Recall 3  Language- name 2 objects 2  Language- repeat 1  Language- follow 3 step command 3  Language- read & follow direction 1  Write a sentence 1  Copy design 1  Total score 30        Immunization History  Administered Date(s) Administered  . PFIZER SARS-COV-2 Vaccination 10/10/2019, 10/31/2019   Screening Tests Health Maintenance  Topic Date Due  . DEXA SCAN  11/30/2018  .  INFLUENZA VACCINE  Never done  . MAMMOGRAM  06/26/2019  . Fecal DNA (Cologuard)  09/19/2019  . TETANUS/TDAP  06/21/2024 (Originally 02/02/1974)  . Hepatitis C Screening  Completed  . HIV Screening  Completed      Plan:    Please schedule your next medicare wellness visit with me in 1 yr.  Continue to eat heart healthy diet (full of fruits, vegetables, whole grains, lean protein, water--limit salt, fat, and sugar intake) and increase physical activity as tolerated.  Continue doing brain stimulating activities (puzzles, reading, adult coloring books, staying active) to keep memory sharp.    I have personally reviewed and noted the following in the patient's chart:   . Medical and social history . Use of alcohol, tobacco or illicit drugs  . Current medications and supplements . Functional ability and status . Nutritional status . Physical activity . Advanced directives . List of other physicians . Hospitalizations, surgeries, and ER visits in previous 12 months . Vitals . Screenings to include cognitive, depression, and falls . Referrals and appointments  In addition, I have reviewed and discussed with patient certain preventive protocols, quality metrics, and best practice recommendations. A written personalized care plan for preventive services as well as general preventive health recommendations were provided to patient.     Shela Nevin, [place]  11/04/2019

## 2019-11-04 ENCOUNTER — Ambulatory Visit (INDEPENDENT_AMBULATORY_CARE_PROVIDER_SITE_OTHER): Payer: Medicare HMO | Admitting: *Deleted

## 2019-11-04 ENCOUNTER — Encounter: Payer: Self-pay | Admitting: *Deleted

## 2019-11-04 ENCOUNTER — Other Ambulatory Visit: Payer: Self-pay

## 2019-11-04 DIAGNOSIS — Z0000 Encounter for general adult medical examination without abnormal findings: Secondary | ICD-10-CM

## 2019-11-04 NOTE — Patient Instructions (Signed)
Please schedule your next medicare wellness visit with me in 1 yr.  Continue to eat heart healthy diet (full of fruits, vegetables, whole grains, lean protein, water--limit salt, fat, and sugar intake) and increase physical activity as tolerated.  Continue doing brain stimulating activities (puzzles, reading, adult coloring books, staying active) to keep memory sharp.    [name] , Thank you for taking time to come for your Medicare Wellness Visit. I appreciate your ongoing commitment to your health goals. Please review the following plan we discussed and let me know if I can assist you in the future.   These are the goals we discussed: Goals    . Lose weight through exercise.    . To be more positive       This is a list of the screening recommended for you and due dates:  Health Maintenance  Topic Date Due  . DEXA scan (bone density measurement)  11/30/2018  . Flu Shot  Never done  . Mammogram  06/26/2019  . Cologuard (Stool DNA test)  09/19/2019  . Tetanus Vaccine  06/21/2024*  .  Hepatitis C: One time screening is recommended by Center for Disease Control  (CDC) for  adults born from [birth year] through 1966.   Completed  . HIV Screening  Completed  *Topic was postponed. The date shown is not the original due date.    Preventive Care 96-79 Years Old, Female Preventive care refers to visits with your health care provider and lifestyle choices that can promote health and wellness. This includes:  A yearly physical exam. This may also be called an annual well check.  Regular dental visits and eye exams.  Immunizations.  Screening for certain conditions.  Healthy lifestyle choices, such as eating a healthy diet, getting regular exercise, not using drugs or products that contain nicotine and tobacco, and limiting alcohol use. What can I expect for my preventive care visit? Physical exam Your health care provider will check your:  Height and weight. This may be used to  calculate body mass index (BMI), which tells if you are at a healthy weight.  Heart rate and blood pressure.  Skin for abnormal spots. Counseling Your health care provider may ask you questions about your:  Alcohol, tobacco, and drug use.  Emotional well-being.  Home and relationship well-being.  Sexual activity.  Eating habits.  Work and work Statistician.  Method of birth control.  Menstrual cycle.  Pregnancy history. What immunizations do I need?  Influenza (flu) vaccine  This is recommended every year. Tetanus, diphtheria, and pertussis (Tdap) vaccine  You may need a Td booster every 10 years. Varicella (chickenpox) vaccine  You may need this if you have not been vaccinated. Zoster (shingles) vaccine  You may need this after age 25. Measles, mumps, and rubella (MMR) vaccine  You may need at least one dose of MMR if you were born in 1957 or later. You may also need a second dose. Pneumococcal conjugate (PCV13) vaccine  You may need this if you have certain conditions and were not previously vaccinated. Pneumococcal polysaccharide (PPSV23) vaccine  You may need one or two doses if you smoke cigarettes or if you have certain conditions. Meningococcal conjugate (MenACWY) vaccine  You may need this if you have certain conditions. Hepatitis A vaccine  You may need this if you have certain conditions or if you travel or work in places where you may be exposed to hepatitis A. Hepatitis B vaccine  You may need this if  you have certain conditions or if you travel or work in places where you may be exposed to hepatitis B. Haemophilus influenzae type b (Hib) vaccine  You may need this if you have certain conditions. Human papillomavirus (HPV) vaccine  If recommended by your health care provider, you may need three doses over 6 months. You may receive vaccines as individual doses or as more than one vaccine together in one shot (combination vaccines). Talk with  your health care provider about the risks and benefits of combination vaccines. What tests do I need? Blood tests  Lipid and cholesterol levels. These may be checked every 5 years, or more frequently if you are over 81 years old.  Hepatitis C test.  Hepatitis B test. Screening  Lung cancer screening. You may have this screening every year starting at age 60 if you have a 30-pack-year history of smoking and currently smoke or have quit within the past 15 years.  Colorectal cancer screening. All adults should have this screening starting at age 54 and continuing until age 57. Your health care provider may recommend screening at age 50 if you are at increased risk. You will have tests every 1-10 years, depending on your results and the type of screening test.  Diabetes screening. This is done by checking your blood sugar (glucose) after you have not eaten for a while (fasting). You may have this done every 1-3 years.  Mammogram. This may be done every 1-2 years. Talk with your health care provider about when you should start having regular mammograms. This may depend on whether you have a family history of breast cancer.  BRCA-related cancer screening. This may be done if you have a family history of breast, ovarian, tubal, or peritoneal cancers.  Pelvic exam and Pap test. This may be done every 3 years starting at age 29. Starting at age 71, this may be done every 5 years if you have a Pap test in combination with an HPV test. Other tests  Sexually transmitted disease (STD) testing.  Bone density scan. This is done to screen for osteoporosis. You may have this scan if you are at high risk for osteoporosis. Follow these instructions at home: Eating and drinking  Eat a diet that includes fresh fruits and vegetables, whole grains, lean protein, and low-fat dairy.  Take vitamin and mineral supplements as recommended by your health care provider.  Do not drink alcohol if: ? Your health  care provider tells you not to drink. ? You are pregnant, may be pregnant, or are planning to become pregnant.  If you drink alcohol: ? Limit how much you have to 0-1 drink a day. ? Be aware of how much alcohol is in your drink. In the [place] one drink equals one 12 oz bottle of beer (355 mL), one 5 oz glass of wine (148 mL), or one 1 oz glass of hard liquor (44 mL). Lifestyle  Take daily care of your teeth and gums.  Stay active. Exercise for at least 30 minutes on 5 or more days each week.  Do not use any products that contain nicotine or tobacco, such as cigarettes, e-cigarettes, and chewing tobacco. If you need help quitting, ask your health care provider.  If you are sexually active, practice safe sex. Use a condom or other form of birth control (contraception) in order to prevent pregnancy and STIs (sexually transmitted infections).  If told by your health care provider, take low-dose aspirin daily starting at age 63. What's next?  Visit your health care provider once a year for a well check visit.  Ask your health care provider how often you should have your eyes and teeth checked.  Stay up to date on all vaccines. This information is not intended to replace advice given to you by your health care provider. Make sure you discuss any questions you have with your health care provider. Document Revised: 04/04/2018 Document Reviewed: 04/04/2018 Elsevier Patient Education  2020 [employer].

## 2019-11-10 ENCOUNTER — Ambulatory Visit (HOSPITAL_BASED_OUTPATIENT_CLINIC_OR_DEPARTMENT_OTHER)
Admission: RE | Admit: 2019-11-10 | Discharge: 2019-11-10 | Disposition: A | Discharge status: Home / Self Care | Payer: Medicare HMO | Source: Ambulatory Visit | Attending: Family Medicine | Admitting: Family Medicine

## 2019-11-10 ENCOUNTER — Ambulatory Visit (HOSPITAL_BASED_OUTPATIENT_CLINIC_OR_DEPARTMENT_OTHER): Payer: Medicare HMO

## 2019-11-10 ENCOUNTER — Ambulatory Visit (INDEPENDENT_AMBULATORY_CARE_PROVIDER_SITE_OTHER): Payer: Medicare HMO | Admitting: Family Medicine

## 2019-11-10 ENCOUNTER — Encounter: Payer: Self-pay | Admitting: Family Medicine

## 2019-11-10 ENCOUNTER — Other Ambulatory Visit: Payer: Self-pay

## 2019-11-10 VITALS — BP 110/66 | HR 62 | Temp 97.3°F | Resp 18 | Ht 60.0 in | Wt 142.0 lb

## 2019-11-10 DIAGNOSIS — K52831 Collagenous colitis: Secondary | ICD-10-CM | POA: Diagnosis not present

## 2019-11-10 DIAGNOSIS — Z0000 Encounter for general adult medical examination without abnormal findings: Secondary | ICD-10-CM

## 2019-11-10 DIAGNOSIS — Z780 Asymptomatic menopausal state: Secondary | ICD-10-CM | POA: Insufficient documentation

## 2019-11-10 DIAGNOSIS — M85831 Other specified disorders of bone density and structure, right forearm: Secondary | ICD-10-CM | POA: Insufficient documentation

## 2019-11-10 DIAGNOSIS — Z13820 Encounter for screening for osteoporosis: Secondary | ICD-10-CM

## 2019-11-10 DIAGNOSIS — Z136 Encounter for screening for cardiovascular disorders: Secondary | ICD-10-CM | POA: Diagnosis not present

## 2019-11-10 LAB — COMPREHENSIVE METABOLIC PANEL
ALT: 14 U/L (ref 0–35)
AST: 19 U/L (ref 0–37)
Albumin: 3.9 g/dL (ref 3.5–5.2)
Alkaline Phosphatase: 57 U/L (ref 39–117)
BUN: 11 mg/dL (ref 6–23)
CO2: 29 mEq/L (ref 19–32)
Calcium: 9.1 mg/dL (ref 8.4–10.5)
Chloride: 104 mEq/L (ref 96–112)
Creatinine, Ser: 0.63 mg/dL (ref 0.40–1.20)
GFR: 94.91 mL/min (ref >60.00)
Glucose, Bld: 92 mg/dL (ref 70–99)
Potassium: 4.2 mEq/L (ref 3.5–5.1)
Sodium: 140 mEq/L (ref 135–145)
Total Bilirubin: 0.5 mg/dL (ref 0.2–1.2)
Total Protein: 6.5 g/dL (ref 6.0–8.3)

## 2019-11-10 LAB — LIPID PANEL
Cholesterol: 210 mg/dL — ABNORMAL HIGH (ref 0–200)
HDL: 58.1 mg/dL (ref >39.00)
LDL Cholesterol: 127 mg/dL — ABNORMAL HIGH (ref 0–99)
NonHDL: 151.77
Total CHOL/HDL Ratio: 4
Triglycerides: 122 mg/dL (ref 0.0–149.0)
VLDL: 24.4 mg/dL (ref 0.0–40.0)

## 2019-11-10 NOTE — Patient Instructions (Signed)

## 2019-11-10 NOTE — Progress Notes (Signed)
Subjective:     Tanya Griffith is a 64 y.o. female and is here for a comprehensive physical exam. The patient reports problems - has had a bad experience with a GI dr and is going to [facility] now to integrative dr and needs a new referral for a new GI dr.    Lavena Griffith History   Socioeconomic History  . Marital status: Married    Spouse name: Not on file  . Number of children: 1  . Years of education: Not on file  . Highest education level: Not on file  Occupational History  . Occupation: unemployed  Tobacco Use  . Smoking status: Never Smoker  . Smokeless tobacco: Never Used  Substance and Sexual Activity  . Alcohol use: No  . Drug use: No  . Sexual activity: Not Currently  Other Topics Concern  . Not on file  Social History Narrative   Exercise-walks   Social Determinants of Health   Financial Resource Strain: Low Risk   . Difficulty of Paying Living Expenses: Not hard at all  Food Insecurity: No Food Insecurity  . Worried About Charity fundraiser in the Last Year: Never true  . Ran Out of Food in the Last Year: Never true  Transportation Needs: No Transportation Needs  . Lack of Transportation (Medical): No  . Lack of Transportation (Non-Medical): No  Physical Activity:   . Days of Exercise per Week:   . Minutes of Exercise per Session:   Stress:   . Feeling of Stress :   Social Connections:   . Frequency of Communication with Friends and Family:   . Frequency of Social Gatherings with Friends and Family:   . Attends Religious Services:   . Active Member of Clubs or Organizations:   . Attends Archivist Meetings:   Marland Kitchen Marital Status:   Intimate Partner Violence:   . Fear of Current or Ex-Partner:   . Emotionally Abused:   Marland Kitchen Physically Abused:   . Sexually Abused:    Health Maintenance  Topic Date Due  . DEXA SCAN  11/30/2018  . MAMMOGRAM  06/26/2019  . Fecal DNA (Cologuard)  09/19/2019  . TETANUS/TDAP  06/21/2024 (Originally [birth year]1974)  .  INFLUENZA VACCINE  03/07/2020  . Hepatitis C Screening  Completed  . HIV Screening  Completed    The following portions of the patient's history were reviewed and updated as appropriate:  She  has a past medical history of Abdominal wall ulcer ([place]), Back problem, Chicken pox, Environmental allergies, IC (interstitial cystitis), Interstitial cystitis, Mitral valve prolapse, and Osteoarthritis (arthritis due to wear and tear of joints). She does not have any pertinent problems on file. She  has a past surgical history that includes Abdominal hysterectomy; Tonsillectomy; Bladder surgery; Oophorectomy; Knee surgery (Right); Bladder surgery; Nasal septum surgery; Cesarean section; and Colonoscopy (09/05/2019). Her family history includes Alzheimer's disease in her mother; Arthritis in her brother, maternal aunt, maternal grandfather, and mother; Breast cancer in an other family member; Cancer in her sister; Dementia in her mother; Diabetes in her brother and maternal aunt; Esophageal cancer in an other family member; Heart attack in her paternal grandfather; Hyperlipidemia in her mother; Hypertension in her brother; Kidney disease (age of onset: 83) in her mother; Pancreatic cancer in her maternal uncle; Transient ischemic attack in her maternal aunt and mother. She  reports that she has never smoked. She has never used smokeless tobacco. She reports that she does not drink alcohol or use drugs.  She has a current medication list which includes the following prescription(s): aspirin, b complex-c-folic acid, biotin, budesonide er, calcium-magnesium, flax seed oil, msm, multiple vitamins-minerals, fish oil, and probiotic product. Current Outpatient Medications on File Prior to Visit  Medication Sig Dispense Refill  . aspirin 325 MG tablet Take 325 mg by mouth daily.    . B Complex-C-Folic Acid (STRESS B COMPLEX PO) Take by mouth.    Marland Kitchen BIOTIN PO Take 10,000 mcg by mouth daily.     . Budesonide ER (UCERIS) 9  MG TB24 Take 9 mg by mouth daily. (Patient not taking: Reported on 11/04/2019) 30 tablet 2  . Calcium-Magnesium (CAL-MAG PO) Take 3 tablets by mouth. Ca 1500 and mag 1000mg     . Flaxseed, Linseed, (FLAX SEED OIL) 1000 MG CAPS Take by mouth.    . Methylsulfonylmethane (MSM) 1000 MG TABS Take by mouth. 5 mg With glucosasmine hcl 1500mg     . Multiple Vitamins-Minerals (CENTRUM SILVER PO) Take by mouth.    . Omega-3 Fatty Acids (FISH OIL) 1000 MG CAPS Take by mouth.    . Probiotic Product (PROBIOTIC ADVANCED PO) Take by mouth.     No current facility-administered medications on file prior to visit.   She is allergic to other and sulfa antibiotics..  Review of Systems Review of Systems  Constitutional: Negative for activity change, appetite change and fatigue.  HENT: Negative for hearing loss, congestion, tinnitus and ear discharge.  dentist q86m Eyes: Negative for visual disturbance (see optho q1y -- vision corrected to 20/20 with glasses).  Respiratory: Negative for cough, chest tightness and shortness of breath.   Cardiovascular: Negative for chest pain, palpitations and leg swelling.  Gastrointestinal: Negative for abdominal pain, diarrhea, constipation and abdominal distention.  Genitourinary: Negative for urgency, frequency, decreased urine volume and difficulty urinating.  Musculoskeletal: Negative for back pain, arthralgias and gait problem.  Skin: Negative for color change, pallor and rash.  Neurological: Negative for dizziness, light-headedness, numbness and headaches.  Hematological: Negative for adenopathy. Does not bruise/bleed easily.  Psychiatric/Behavioral: Negative for suicidal ideas, confusion, sleep disturbance, self-injury, dysphoric mood, decreased concentration and agitation.        Objective:    BP 110/66 (BP Location: Right Arm, Patient Position: Sitting, Cuff Size: Normal)   Pulse 62   Temp (!) 97.3 F (36.3 C) (Temporal)   Resp 18   Ht 5' (1.524 m)   Wt 142  lb (64.4 kg)   SpO2 98%   BMI 27.73 kg/m  General appearance: alert, cooperative, appears stated age and no distress Head: Normocephalic, without obvious abnormality, atraumatic Eyes: negative findings: lids and lashes normal, conjunctivae and sclerae normal and pupils equal, round, reactive to light and accomodation Throat: lips, mucosa, and tongue normal; teeth and gums normal Neck: no adenopathy, no carotid bruit, no JVD, supple, symmetrical, trachea midline and thyroid not enlarged, symmetric, no tenderness/mass/nodules Back: symmetric, no curvature. ROM normal. No CVA tenderness. Lungs: clear to auscultation bilaterally Breasts: normal appearance, no masses or tenderness Heart: regular rate and rhythm, S1, S2 normal, no murmur, click, rub or gallop Abdomen: abnormal findings:  soft ,  mild tenderness, no rebound, no guarding  Pelvic: not indicated; status post hysterectomy, negative ROS Extremities: extremities normal, atraumatic, no cyanosis or edema Pulses: 2+ and symmetric Skin: Skin color, texture, turgor normal. No rashes or lesions Lymph nodes: Cervical, supraclavicular, and axillary nodes normal. Neurologic: Alert and oriented X 3, normal strength and tone. Normal symmetric reflexes. Normal coordination and gait    Assessment:  Healthy female exam.      Plan:      ghm utd Check labs  See After Visit Summary for Counseling Recommendations    1. Ischemic heart disease screen Check labs  - Lipid panel; Future - Comprehensive metabolic panel; Future - Comprehensive metabolic panel - Lipid panel  2. Collagenous colitis Pt is going to integrative dr for dietary advice and a more wholistic approach She is requesting a referral to new GI as well - Ambulatory referral to Gastroenterology  3. Preventative health care See above

## 2019-11-20 ENCOUNTER — Telehealth: Payer: Self-pay | Admitting: Gastroenterology

## 2019-11-20 MED ORDER — BISMUTH SUBSALICYLATE 262 MG PO CHEW: 786.0000 mg | CHEWABLE_TABLET | Freq: Three times a day (TID) | ORAL | 0 refills | Status: AC

## 2019-11-20 NOTE — Telephone Encounter (Signed)
Called patient to check She is having persistent diarrhea, also had weight loss.  She is hesitant to take Budesonide due to bad reaction to steroids in the past.  We will do a trial of Peptobismol 3 tablets TID with meals X 30 days Follow up in office visit in 4-6 weeks.

## 2019-11-20 NOTE — Telephone Encounter (Signed)
Pt scheduled to see Dr. Silverio Decamp 12/22/19@9 :50am. Appt letter mailed to pt.

## 2019-12-03 DIAGNOSIS — R634 Abnormal weight loss: Secondary | ICD-10-CM | POA: Diagnosis not present

## 2019-12-03 DIAGNOSIS — E8940 Asymptomatic postprocedural ovarian failure: Secondary | ICD-10-CM | POA: Diagnosis not present

## 2019-12-03 DIAGNOSIS — M8580 Other specified disorders of bone density and structure, unspecified site: Secondary | ICD-10-CM | POA: Diagnosis not present

## 2019-12-03 DIAGNOSIS — K52831 Collagenous colitis: Secondary | ICD-10-CM | POA: Diagnosis not present

## 2019-12-03 DIAGNOSIS — R195 Other fecal abnormalities: Secondary | ICD-10-CM | POA: Diagnosis not present

## 2019-12-03 DIAGNOSIS — G4700 Insomnia, unspecified: Secondary | ICD-10-CM | POA: Diagnosis not present

## 2019-12-10 DIAGNOSIS — K52831 Collagenous colitis: Secondary | ICD-10-CM | POA: Diagnosis not present

## 2019-12-12 DIAGNOSIS — D213 Benign neoplasm of connective and other soft tissue of thorax: Secondary | ICD-10-CM | POA: Diagnosis not present

## 2019-12-12 DIAGNOSIS — R222 Localized swelling, mass and lump, trunk: Secondary | ICD-10-CM | POA: Diagnosis not present

## 2019-12-19 ENCOUNTER — Ambulatory Visit (HOSPITAL_BASED_OUTPATIENT_CLINIC_OR_DEPARTMENT_OTHER): Payer: Medicare HMO

## 2019-12-19 ENCOUNTER — Ambulatory Visit (HOSPITAL_BASED_OUTPATIENT_CLINIC_OR_DEPARTMENT_OTHER)
Admission: RE | Admit: 2019-12-19 | Discharge: 2019-12-19 | Disposition: A | Discharge status: Home / Self Care | Payer: Medicare HMO | Source: Ambulatory Visit | Attending: Family Medicine | Admitting: Family Medicine

## 2019-12-19 ENCOUNTER — Other Ambulatory Visit: Payer: Self-pay

## 2019-12-19 DIAGNOSIS — Z1231 Encounter for screening mammogram for malignant neoplasm of breast: Secondary | ICD-10-CM

## 2019-12-22 ENCOUNTER — Ambulatory Visit: Payer: Medicare HMO | Admitting: Gastroenterology

## 2019-12-22 DIAGNOSIS — K52831 Collagenous colitis: Secondary | ICD-10-CM | POA: Diagnosis not present

## 2020-01-06 DIAGNOSIS — K52831 Collagenous colitis: Secondary | ICD-10-CM | POA: Diagnosis not present

## 2020-01-06 DIAGNOSIS — R634 Abnormal weight loss: Secondary | ICD-10-CM | POA: Diagnosis not present

## 2020-01-06 DIAGNOSIS — G4700 Insomnia, unspecified: Secondary | ICD-10-CM | POA: Diagnosis not present

## 2020-01-06 DIAGNOSIS — E8940 Asymptomatic postprocedural ovarian failure: Secondary | ICD-10-CM | POA: Diagnosis not present

## 2020-01-06 DIAGNOSIS — R195 Other fecal abnormalities: Secondary | ICD-10-CM | POA: Diagnosis not present

## 2020-01-06 DIAGNOSIS — M8580 Other specified disorders of bone density and structure, unspecified site: Secondary | ICD-10-CM | POA: Diagnosis not present

## 2020-01-13 DIAGNOSIS — E8940 Asymptomatic postprocedural ovarian failure: Secondary | ICD-10-CM | POA: Diagnosis not present

## 2020-01-13 DIAGNOSIS — K52831 Collagenous colitis: Secondary | ICD-10-CM | POA: Diagnosis not present

## 2020-02-06 ENCOUNTER — Other Ambulatory Visit: Payer: Self-pay | Admitting: Family Medicine

## 2020-02-06 ENCOUNTER — Encounter: Payer: Self-pay | Admitting: Family Medicine

## 2020-02-06 DIAGNOSIS — T753XXA Motion sickness, initial encounter: Secondary | ICD-10-CM

## 2020-02-06 MED ORDER — SCOPOLAMINE 1 MG/3DAYS TD PT72: 1.0000 | MEDICATED_PATCH | TRANSDERMAL | 0 refills | Status: DC

## 2020-02-06 NOTE — Telephone Encounter (Signed)
Patch sent

## 2020-03-13 DIAGNOSIS — Z20822 Contact with and (suspected) exposure to covid-19: Secondary | ICD-10-CM | POA: Diagnosis not present

## 2020-04-05 DIAGNOSIS — K529 Noninfective gastroenteritis and colitis, unspecified: Secondary | ICD-10-CM | POA: Diagnosis not present

## 2020-04-05 DIAGNOSIS — R209 Unspecified disturbances of skin sensation: Secondary | ICD-10-CM | POA: Diagnosis not present

## 2020-04-05 DIAGNOSIS — M170 Bilateral primary osteoarthritis of knee: Secondary | ICD-10-CM | POA: Diagnosis not present

## 2020-04-05 DIAGNOSIS — E8941 Symptomatic postprocedural ovarian failure: Secondary | ICD-10-CM | POA: Diagnosis not present

## 2020-04-07 DIAGNOSIS — M170 Bilateral primary osteoarthritis of knee: Secondary | ICD-10-CM | POA: Diagnosis not present

## 2020-04-07 DIAGNOSIS — E8940 Asymptomatic postprocedural ovarian failure: Secondary | ICD-10-CM | POA: Diagnosis not present

## 2020-04-07 DIAGNOSIS — R195 Other fecal abnormalities: Secondary | ICD-10-CM | POA: Diagnosis not present

## 2020-11-12 ENCOUNTER — Other Ambulatory Visit (HOSPITAL_BASED_OUTPATIENT_CLINIC_OR_DEPARTMENT_OTHER): Payer: Self-pay | Admitting: Family Medicine

## 2020-11-12 ENCOUNTER — Other Ambulatory Visit: Payer: Self-pay

## 2020-11-12 ENCOUNTER — Ambulatory Visit (INDEPENDENT_AMBULATORY_CARE_PROVIDER_SITE_OTHER): Payer: Medicare Other | Admitting: Family Medicine

## 2020-11-12 ENCOUNTER — Encounter: Payer: Self-pay | Admitting: Family Medicine

## 2020-11-12 VITALS — BP 128/80 | HR 70 | Temp 98.3°F | Resp 16 | Ht 60.0 in | Wt 138.0 lb

## 2020-11-12 DIAGNOSIS — E785 Hyperlipidemia, unspecified: Secondary | ICD-10-CM | POA: Diagnosis not present

## 2020-11-12 DIAGNOSIS — N3010 Interstitial cystitis (chronic) without hematuria: Secondary | ICD-10-CM

## 2020-11-12 DIAGNOSIS — Z1231 Encounter for screening mammogram for malignant neoplasm of breast: Secondary | ICD-10-CM

## 2020-11-12 DIAGNOSIS — Z136 Encounter for screening for cardiovascular disorders: Secondary | ICD-10-CM

## 2020-11-12 DIAGNOSIS — D219 Benign neoplasm of connective and other soft tissue, unspecified: Secondary | ICD-10-CM | POA: Diagnosis not present

## 2020-11-12 DIAGNOSIS — Z0000 Encounter for general adult medical examination without abnormal findings: Secondary | ICD-10-CM

## 2020-11-12 DIAGNOSIS — K52831 Collagenous colitis: Secondary | ICD-10-CM | POA: Diagnosis not present

## 2020-11-12 DIAGNOSIS — Z23 Encounter for immunization: Secondary | ICD-10-CM

## 2020-11-12 LAB — LIPID PANEL
Cholesterol: 206 mg/dL — ABNORMAL HIGH (ref 0–200)
HDL: 69 mg/dL (ref >39.00)
LDL Cholesterol: 116 mg/dL — ABNORMAL HIGH (ref 0–99)
NonHDL: 136.75
Total CHOL/HDL Ratio: 3
Triglycerides: 104 mg/dL (ref 0.0–149.0)
VLDL: 20.8 mg/dL (ref 0.0–40.0)

## 2020-11-12 LAB — COMPREHENSIVE METABOLIC PANEL
ALT: 17 U/L (ref 0–35)
AST: 24 U/L (ref 0–37)
Albumin: 4.1 g/dL (ref 3.5–5.2)
Alkaline Phosphatase: 54 U/L (ref 39–117)
BUN: 16 mg/dL (ref 6–23)
CO2: 30 mEq/L (ref 19–32)
Calcium: 9.4 mg/dL (ref 8.4–10.5)
Chloride: 103 mEq/L (ref 96–112)
Creatinine, Ser: 0.65 mg/dL (ref 0.40–1.20)
GFR: 92.16 mL/min (ref >60.00)
Glucose, Bld: 90 mg/dL (ref 70–99)
Potassium: 4.1 mEq/L (ref 3.5–5.1)
Sodium: 137 mEq/L (ref 135–145)
Total Bilirubin: 0.7 mg/dL (ref 0.2–1.2)
Total Protein: 6.9 g/dL (ref 6.0–8.3)

## 2020-11-12 LAB — CBC WITH DIFFERENTIAL/PLATELET
Basophils Absolute: 0.1 10*3/uL (ref 0.0–0.1)
Basophils Relative: 1.3 % (ref 0.0–3.0)
Eosinophils Absolute: 0.1 10*3/uL (ref 0.0–0.7)
Eosinophils Relative: 2 % (ref 0.0–5.0)
HCT: 37.8 % (ref 36.0–46.0)
Hemoglobin: 13 g/dL (ref 12.0–15.0)
Lymphocytes Relative: 31.2 % (ref 12.0–46.0)
Lymphs Abs: 1.3 10*3/uL (ref 0.7–4.0)
MCHC: 34.5 g/dL (ref 30.0–36.0)
MCV: 101.4 fl — ABNORMAL HIGH (ref 78.0–100.0)
Monocytes Absolute: 0.4 10*3/uL (ref 0.1–1.0)
Monocytes Relative: 9 % (ref 3.0–12.0)
Neutro Abs: 2.4 10*3/uL (ref 1.4–7.7)
Neutrophils Relative %: 56.5 % (ref 43.0–77.0)
Platelets: 275 10*3/uL (ref 150.0–400.0)
RBC: 3.72 Mil/uL — ABNORMAL LOW (ref 3.87–5.11)
RDW: 13.1 % (ref 11.5–15.5)
WBC: 4.3 10*3/uL (ref 4.0–10.5)

## 2020-11-12 NOTE — Patient Instructions (Signed)
Preventive Care 66 Years and Older, Female Preventive care refers to lifestyle choices and visits with your health care provider that can promote health and wellness. This includes:  A yearly physical exam. This is also called an annual wellness visit.  Regular dental and eye exams.  Immunizations.  Screening for certain conditions.  Healthy lifestyle choices, such as: ? Eating a healthy diet. ? Getting regular exercise. ? Not using drugs or products that contain nicotine and tobacco. ? Limiting alcohol use. What can I expect for my preventive care visit? Physical exam Your health care provider will check your:  Height and weight. These may be used to calculate your BMI (body mass index). BMI is a measurement that tells if you are at a healthy weight.  Heart rate and blood pressure.  Body temperature.  Skin for abnormal spots. Counseling Your health care provider may ask you questions about your:  Past medical problems.  Family's medical history.  Alcohol, tobacco, and drug use.  Emotional well-being.  Home life and relationship well-being.  Sexual activity.  Diet, exercise, and sleep habits.  History of falls.  Memory and ability to understand (cognition).  Work and work Statistician.  Pregnancy and menstrual history.  Access to firearms. What immunizations do I need? Vaccines are usually given at various ages, according to a schedule. Your health care provider will recommend vaccines for you based on your age, medical history, and lifestyle or other factors, such as travel or where you work.   What tests do I need? Blood tests  Lipid and cholesterol levels. These may be checked every 5 years, or more often depending on your overall health.  Hepatitis C test.  Hepatitis B test. Screening  Lung cancer screening. You may have this screening every year starting at age 66 if you have a 30-pack-year history of smoking and currently smoke or have quit within  the past 15 years.  Colorectal cancer screening. ? All adults should have this screening starting at age 66 and continuing until age 66. ? Your health care provider may recommend screening at age 2 if you are at increased risk. ? You will have tests every 1-10 years, depending on your results and the type of screening test.  Diabetes screening. ? This is done by checking your blood sugar (glucose) after you have not eaten for a while (fasting). ? You may have this done every 1-3 years.  Mammogram. ? This may be done every 1-2 years. ? Talk with your health care provider about how often you should have regular mammograms.  Abdominal aortic aneurysm (AAA) screening. You may need this if you are a current or former smoker.  BRCA-related cancer screening. This may be done if you have a family history of breast, ovarian, tubal, or peritoneal cancers. Other tests  STD (sexually transmitted disease) testing, if you are at risk.  Bone density scan. This is done to screen for osteoporosis. You may have this done starting at age 66. Talk with your health care provider about your test results, treatment options, and if necessary, the need for more tests. Follow these instructions at home: Eating and drinking  Eat a diet that includes fresh fruits and vegetables, whole grains, lean protein, and low-fat dairy products. Limit your intake of foods with high amounts of sugar, saturated fats, and salt.  Take vitamin and mineral supplements as recommended by your health care provider.  Do not drink alcohol if your health care provider tells you not to drink.  If you drink alcohol: ? Limit how much you have to 0-1 drink a day. ? Be aware of how much alcohol is in your drink. In the [place] one drink equals one 12 oz bottle of beer (355 mL), one 5 oz glass of wine (148 mL), or one 1 oz glass of hard liquor (44 mL).   Lifestyle  Take daily care of your teeth and gums. Brush your teeth every morning  and night with fluoride toothpaste. Floss one time each day.  Stay active. Exercise for at least 30 minutes 5 or more days each week.  Do not use any products that contain nicotine or tobacco, such as cigarettes, e-cigarettes, and chewing tobacco. If you need help quitting, ask your health care provider.  Do not use drugs.  If you are sexually active, practice safe sex. Use a condom or other form of protection in order to prevent STIs (sexually transmitted infections).  Talk with your health care provider about taking a low-dose aspirin or statin.  Find healthy ways to cope with stress, such as: ? Meditation, yoga, or listening to music. ? Journaling. ? Talking to a trusted person. ? Spending time with friends and family. Safety  Always wear your seat belt while driving or riding in a vehicle.  Do not drive: ? If you have been drinking alcohol. Do not ride with someone who has been drinking. ? When you are tired or distracted. ? While texting.  Wear a helmet and other protective equipment during sports activities.  If you have firearms in your house, make sure you follow all gun safety procedures. What's next?  Visit your health care provider once a year for an annual wellness visit.  Ask your health care provider how often you should have your eyes and teeth checked.  Stay up to date on all vaccines. This information is not intended to replace advice given to you by your health care provider. Make sure you discuss any questions you have with your health care provider. Document Revised: 07/14/2020 Document Reviewed: 07/18/2018 Elsevier Patient Education  2021 [employer] Inc.  

## 2020-11-12 NOTE — Progress Notes (Signed)
Patient ID: Tanya Griffith, female    DOB: 02-23-1956  Age: 65 y.o. MRN: [id number]    Subjective:  Subjective  HPI [name] presents for an office visit today. She complains of neck pain. 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 She states that she has been managing her Dx of interstitial cystitis by taking CBD oil and capsule an hour before bed and it had relieved her symptoms.She denies any chest pain, SOB, fever, abdominal pain, cough, chills, sore throat, dysuria, back pain, HA, or N/V/D at this time. She reports she is going to get pneumonia, shingles, and COVID vaccine.  Review of Systems  Constitutional: Negative for chills, fatigue and fever.  HENT: Negative for congestion, ear pain, sinus pressure, sinus pain and sore throat.   Eyes: Negative for pain.  Respiratory: Negative for cough and shortness of breath.   Cardiovascular: Negative for chest pain, palpitations and leg swelling.  Gastrointestinal: Negative for abdominal pain, blood in stool, constipation, diarrhea, nausea and vomiting.  Genitourinary: Positive for frequency (CPD oil and capsule). Negative for dysuria, hematuria and urgency.  Musculoskeletal: Positive for neck pain (secondary to scoliosis ). Negative for back pain.       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   Neurological: Negative for dizziness and headaches.    History Past Medical History:  Diagnosis Date  . Abdominal wall ulcer ([place])   . Back problem   . Chicken pox   . Environmental allergies   . IC (interstitial cystitis)   . Interstitial cystitis   . Mitral valve prolapse   . Osteoarthritis (arthritis due to wear and tear of joints)     She has a past surgical history that includes Abdominal hysterectomy; Tonsillectomy; Bladder surgery;  Oophorectomy; Knee surgery (Right); Bladder surgery; Nasal septum surgery; Cesarean section; and Colonoscopy (09/05/2019).   Her family history includes Alzheimer's disease in her mother; Arthritis in her brother, maternal aunt, maternal grandfather, and mother; Breast cancer in an other family member; Cancer in her sister; Dementia in her mother; Diabetes in her brother and maternal aunt; Esophageal cancer in an other family member; Heart attack in her paternal grandfather; Hyperlipidemia in her mother; Hypertension in her brother; Kidney disease (age of onset: 65) in her mother; Pancreatic cancer in her maternal uncle; Transient ischemic attack in her maternal aunt and mother.She reports that she has never smoked. She has never used smokeless tobacco. She reports that she does not drink alcohol and does not use drugs.  Current Outpatient Medications on File Prior to Visit  Medication Sig Dispense Refill  . aspirin 325 MG tablet Take 325 mg by mouth daily.    . B Complex-C-Folic Acid (STRESS B COMPLEX PO) Take by mouth.    Marland Kitchen BIOTIN PO Take 10,000 mcg by mouth daily.     Marland Kitchen bismuth subsalicylate (PEPTO-BISMOL) 262 MG chewable tablet Chew 3 tablets (786 mg total) by mouth 4 (four) times daily -  before meals and at bedtime. 360 tablet 0  . Budesonide ER (UCERIS) 9 MG TB24 Take 9 mg by mouth daily. 30 tablet 2  . Calcium-Magnesium (CAL-MAG PO) Take 3 tablets by mouth. Ca 1500 and mag 1000mg     . Flaxseed, Linseed, (FLAX SEED OIL) 1000 MG CAPS Take by mouth.    . Methylsulfonylmethane (MSM) 1000 MG TABS Take by mouth. 5 mg  With glucosasmine hcl 1500mg     . Omega-3 Fatty Acids (FISH OIL) 1000 MG CAPS Take by mouth.    . Probiotic Product (PROBIOTIC ADVANCED PO) Take by mouth.     No current facility-administered medications on file prior to visit.     Objective:  Objective  Physical Exam Vitals and nursing note reviewed.  Constitutional:      General: She is not in acute distress.    Appearance:  Normal appearance. She is well-developed. She is not ill-appearing.  HENT:     Head: Normocephalic and atraumatic.     Right Ear: Tympanic membrane, ear canal and external ear normal.     Left Ear: Tympanic membrane, ear canal and external ear normal.     Nose: Nose normal.  Eyes:     Extraocular Movements: Extraocular movements intact.     Pupils: Pupils are equal, round, and reactive to light.  Cardiovascular:     Rate and Rhythm: Normal rate and regular rhythm.     Pulses: Normal pulses.     Heart sounds: Normal heart sounds. No murmur heard. No friction rub. No gallop.   Pulmonary:     Effort: Pulmonary effort is normal. No respiratory distress.     Breath sounds: Normal breath sounds. No stridor. No wheezing, rhonchi or rales.  Abdominal:     General: Bowel sounds are normal. There is no distension.     Palpations: Abdomen is soft. There is no mass.     Tenderness: There is no abdominal tenderness. There is no guarding.     Hernia: No hernia is present.  Musculoskeletal:        General: Normal range of motion.     Cervical back: Normal range of motion and neck supple.  Skin:    General: Skin is warm and dry.  Neurological:     Mental Status: She is alert and oriented to person, place, and time.  Psychiatric:        Behavior: Behavior normal.        Thought Content: Thought content normal.    BP 128/80 (BP Location: Right Arm, Patient Position: Sitting, Cuff Size: Normal)   Pulse 70   Temp 98.3 F (36.8 C)   Resp 16   Ht 5' (1.524 m)   Wt 138 lb (62.6 kg)   SpO2 98%   BMI 26.95 kg/m  Wt Readings from Last 3 Encounters:  11/12/20 138 lb (62.6 kg)  11/10/19 142 lb (64.4 kg)  10/28/19 143 lb 12.8 oz (65.2 kg)     Lab Results  Component Value Date   WBC 6.9 06/21/2018   HGB 13.1 06/21/2018   HCT 38.3 06/21/2018   PLT 320 06/21/2018   GLUCOSE 92 11/10/2019   CHOL 210 (H) 11/10/2019   TRIG 122.0 11/10/2019   HDL 58.10 11/10/2019   LDLCALC 127 (H) 11/10/2019    ALT 14 11/10/2019   AST 19 11/10/2019   NA 140 11/10/2019   K 4.2 11/10/2019   CL 104 11/10/2019   CREATININE 0.63 11/10/2019   BUN 11 11/10/2019   CO2 29 11/10/2019   TSH 3.36 06/21/2018   HGBA1C 5.6 06/08/2014    MM 3D SCREEN BREAST BILATERAL  Result Date: 12/22/2019 CLINICAL DATA:  Screening. EXAM: DIGITAL SCREENING BILATERAL MAMMOGRAM WITH TOMO AND CAD COMPARISON:  Previous exam(s). ACR Breast Density Category b: There are scattered areas of fibroglandular density. FINDINGS: There are no findings suspicious for malignancy. Images were processed with CAD. IMPRESSION: No mammographic evidence  of malignancy. A result letter of this screening mammogram will be mailed directly to the patient. RECOMMENDATION: Screening mammogram in one year. (Code:SM-B-01Y) BI-RADS CATEGORY  1: Negative. Electronically Signed   By: Curlene Dolphin M.D.   On: 12/22/2019 07:55     Assessment & Plan:  Plan    No orders of the defined types were placed in this encounter.   Problem List Items Addressed This Visit      Unprioritized   Elastofibroma    Per [facility]       Interstitial cystitis    Controlled with cbd oil       Preventative health care - Primary    ghm utd Check labs  See avs       Other Visit Diagnoses    Need for pneumococcal vaccination       Relevant Orders   Pneumococcal polysaccharide vaccine 23-valent greater than or equal to 2yo subcutaneous/IM (Completed)   Ischemic heart disease screen       Relevant Orders   Lipid panel   CBC with Differential/Platelet   Comprehensive metabolic panel   Hyperlipidemia, unspecified hyperlipidemia type       Relevant Orders   Lipid panel   CBC with Differential/Platelet   Comprehensive metabolic panel   Collagenous colitis       Relevant Orders   Lipid panel   CBC with Differential/Platelet   Comprehensive metabolic panel      Follow-up: Return in about 1 year (around 11/12/2021), or if symptoms worsen or fail to  improve, for annual exam, fasting.   I,Gordon Zheng,acting as a Education administrator for [employer], DO.,have documented all relevant documentation on the behalf of Ann Held, DO,as directed by  Ann Held, DO while in the presence of [place], DO, have reviewed all documentation for this visit. The documentation on 11/12/20 for the exam, diagnosis, procedures, and orders are all accurate and complete.

## 2020-11-12 NOTE — Assessment & Plan Note (Signed)
ghm utd Check labs  See avs  

## 2020-11-12 NOTE — Assessment & Plan Note (Signed)
Controlled with cbd oil

## 2020-11-12 NOTE — Assessment & Plan Note (Signed)
Per [facility]

## 2020-11-17 ENCOUNTER — Ambulatory Visit (HOSPITAL_BASED_OUTPATIENT_CLINIC_OR_DEPARTMENT_OTHER)
Admission: RE | Admit: 2020-11-17 | Discharge: 2020-11-17 | Disposition: A | Discharge status: Home / Self Care | Payer: Medicare Other | Source: Ambulatory Visit | Attending: Family Medicine | Admitting: Family Medicine

## 2020-11-17 ENCOUNTER — Other Ambulatory Visit: Payer: Self-pay

## 2020-11-17 DIAGNOSIS — Z1231 Encounter for screening mammogram for malignant neoplasm of breast: Secondary | ICD-10-CM | POA: Insufficient documentation

## 2020-11-24 ENCOUNTER — Ambulatory Visit: Payer: Medicare Other

## 2020-11-24 NOTE — Progress Notes (Deleted)
Subjective:   Tanya Griffith is a 65 y.o. female who presents for Medicare Annual (Subsequent) preventive examination.  I connected with *** today by telephone and verified that I am speaking with the correct person using two identifiers. Location patient: home Location provider: work Persons participating in the virtual visit: patient, Marine scientist.    I discussed the limitations, risks, security and privacy concerns of performing an evaluation and management service by telephone and the availability of in person appointments. I also discussed with the patient that there may be a patient responsible charge related to this service. The patient expressed understanding and verbally consented to this telephonic visit.    Interactive audio and video telecommunications were attempted between this provider and patient, however failed, due to patient having technical difficulties OR patient did not have access to video capability.  We continued and completed visit with audio only.  Some vital signs may be absent or patient reported.   Time Spent with patient on telephone encounter: *** minutes   Review of Systems    ***       Objective:    There were no vitals filed for this visit. There is no height or weight on file to calculate BMI.  Advanced Directives 11/04/2019 06/21/2018 11/13/2016  Does Patient Have a Medical Advance Directive? No Yes No  Type of Advance Directive - [place];Living will -  Copy of [place] in Chart? - No - copy requested -  Would patient like information on creating a medical advance directive? No - Patient declined - Yes (MAU/Ambulatory/Procedural Areas - Information given)    Current Medications (verified) Outpatient Encounter Medications as of 11/24/2020  Medication Sig  . aspirin 325 MG tablet Take 325 mg by mouth daily.  . B Complex-C-Folic Acid (STRESS B COMPLEX PO) Take by mouth.  Marland Kitchen BIOTIN PO Take 10,000 mcg by mouth  daily.   Marland Kitchen bismuth subsalicylate (PEPTO-BISMOL) 262 MG chewable tablet Chew 3 tablets (786 mg total) by mouth 4 (four) times daily -  before meals and at bedtime.  . Budesonide ER (UCERIS) 9 MG TB24 Take 9 mg by mouth daily.  . Calcium-Magnesium (CAL-MAG PO) Take 3 tablets by mouth. Ca 1500 and mag 1000mg   . Flaxseed, Linseed, (FLAX SEED OIL) 1000 MG CAPS Take by mouth.  . Methylsulfonylmethane (MSM) 1000 MG TABS Take by mouth. 5 mg With glucosasmine hcl 1500mg   . Omega-3 Fatty Acids (FISH OIL) 1000 MG CAPS Take by mouth.  . Probiotic Product (PROBIOTIC ADVANCED PO) Take by mouth.   No facility-administered encounter medications on file as of 11/24/2020.    Allergies (verified) Other and Sulfa antibiotics   History: Past Medical History:  Diagnosis Date  . Abdominal wall ulcer ([place])   . Back problem   . Chicken pox   . Environmental allergies   . IC (interstitial cystitis)   . Interstitial cystitis   . Mitral valve prolapse   . Osteoarthritis (arthritis due to wear and tear of joints)    Past Surgical History:  Procedure Laterality Date  . ABDOMINAL HYSTERECTOMY    . BLADDER SURGERY    . BLADDER SURGERY    . CESAREAN SECTION    . COLONOSCOPY  09/05/2019  . KNEE SURGERY Right   . NASAL SEPTUM SURGERY    . OOPHORECTOMY    . TONSILLECTOMY     Family History  Problem Relation Age of Onset  . Dementia Mother  Vascular Dementia  . Alzheimer's disease Mother   . Arthritis Mother   . Hyperlipidemia Mother   . Transient ischemic attack Mother   . Kidney disease Mother 4       Kidney Failure  . Cancer Sister        skin  . Arthritis Brother   . Arthritis Maternal Grandfather   . Arthritis Maternal Aunt   . Heart attack Paternal Grandfather   . Transient ischemic attack Maternal Aunt   . Hypertension Brother   . Diabetes Maternal Aunt   . Diabetes Brother        Medication induced  . Breast cancer Other        Niece--Double Mastectomy  . Esophageal cancer  Other   . Pancreatic cancer Maternal Uncle   . Colon cancer Neg Hx   . Liver cancer Neg Hx   . Rectal cancer Neg Hx   . Stomach cancer Neg Hx    Social History   Socioeconomic History  . Marital status: Married    Spouse name: Not on file  . Number of children: 1  . Years of education: Not on file  . Highest education level: Not on file  Occupational History  . Occupation: unemployed  Tobacco Use  . Smoking status: Never Smoker  . Smokeless tobacco: Never Used  Vaping Use  . Vaping Use: Never used  Substance and Sexual Activity  . Alcohol use: No  . Drug use: No  . Sexual activity: Not Currently  Other Topics Concern  . Not on file  Social History Narrative   Exercise-walks   Social Determinants of Health   Financial Resource Strain: Not on file  Food Insecurity: Not on file  Transportation Needs: Not on file  Physical Activity: Not on file  Stress: Not on file  Social Connections: Not on file    Tobacco Counseling Counseling given: Not Answered   Clinical Intake:                 Diabetic?No         Activities of Daily Living No flowsheet data found.  Patient Care Team: Carollee Herter, Alferd Apa, DO as PCP - General (Family Medicine) Votanopoulos, Deeann Dowse, MD as Referring Physician (General Surgery) Ilsa Iha, MD as Referring Physician (General Practice) Aleene Davidson, MD as Referring Physician (Obstetrics and Gynecology) Ronnette Juniper, MD as Consulting Physician (Gastroenterology) Roseanne Kaufman, MD as Consulting Physician (Orthopedic Surgery)  Indicate any recent Medical Services you may have received from other than [facility] providers in the past year (date may be approximate).     Assessment:   This is a routine wellness examination for [place].  Hearing/Vision screen No exam data present  Dietary issues and exercise activities discussed:    Goals    . Lose weight through exercise.    . To be more positive       Depression Screen PHQ 2/9 Scores 11/04/2019 06/25/2018 06/21/2018 11/13/2016  PHQ - 2 Score 0 0 0 0  PHQ- 9 Score - 6 - -    Fall Risk Fall Risk  11/12/2020 11/04/2019 06/21/2018 11/13/2016  Falls in the past year? 1 1 1  Yes  Number falls in past yr: 0 0 0 1  Injury with Fall? 1 1 0 Yes  Comment - - - pt report she tripped over a root sticking up from the ground and hit her right shoulder.  Follow up - Education provided;Falls prevention discussed - -  FALL RISK PREVENTION PERTAINING TO THE HOME:  Any stairs in or around the home? {YES/NO:21197} If so, are there any without handrails? {YES/NO:21197} Home free of loose throw rugs in walkways, pet beds, electrical cords, etc? {YES/NO:21197} Adequate lighting in your home to reduce risk of falls? {YES/NO:21197}  ASSISTIVE DEVICES UTILIZED TO PREVENT FALLS:  Life alert? {YES/NO:21197} Use of a cane, walker or w/c? {YES/NO:21197} Grab bars in the bathroom? {YES/NO:21197} Shower chair or bench in shower? {YES/NO:21197} Elevated toilet seat or a handicapped toilet? {YES/NO:21197}  TIMED UP AND GO:  Was the test performed? {YES/NO:21197}.  Length of time to ambulate 10 feet: *** sec.   {Appearance of Gait:2101803}  Cognitive Function: MMSE - Mini Mental State Exam 11/13/2016  Orientation to time 5  Orientation to Place 5  Registration 3  Attention/ Calculation 5  Recall 3  Language- name 2 objects 2  Language- repeat 1  Language- follow 3 step command 3  Language- read & follow direction 1  Write a sentence 1  Copy design 1  Total score 30        Immunizations Immunization History  Administered Date(s) Administered  . PFIZER(Purple Top)SARS-COV-2 Vaccination 10/10/2019, 10/31/2019, 05/10/2020  . Pneumococcal Polysaccharide-23 11/12/2020    TDAP status: Due, Education has been provided regarding the importance of this vaccine. Advised may receive this vaccine at local pharmacy or Health Dept. Aware to provide a copy of  the vaccination record if obtained from local pharmacy or Health Dept. Verbalized acceptance and understanding.  Flu Vaccine status: Due, Education has been provided regarding the importance of this vaccine. Advised may receive this vaccine at local pharmacy or Health Dept. Aware to provide a copy of the vaccination record if obtained from local pharmacy or Health Dept. Verbalized acceptance and understanding.  Pneumococcal vaccine status: Up to date  Covid-19 vaccine status: Completed vaccines  Qualifies for Shingles Vaccine? Yes   Zostavax completed No   Shingrix Completed?: No.    Education has been provided regarding the importance of this vaccine. Patient has been advised to call insurance company to determine out of pocket expense if they have not yet received this vaccine. Advised may also receive vaccine at local pharmacy or Health Dept. Verbalized acceptance and understanding.  Screening Tests Health Maintenance  Topic Date Due  . TETANUS/TDAP  06/21/2024 (Originally 02/02/1975)  . INFLUENZA VACCINE  03/07/2021  . DEXA SCAN  11/09/2021  . PNA vac Low Risk Adult (2 of 2 - PCV13) 11/12/2021  . MAMMOGRAM  11/17/2021  . COLONOSCOPY (Pts 45-15yrs Insurance coverage will need to be confirmed)  09/04/2029  . COVID-19 Vaccine  Completed  . Hepatitis C Screening  Completed  . HIV Screening  Completed  . HPV VACCINES  Aged Out    Health Maintenance  There are no preventive care reminders to display for this patient.  Colorectal cancer screening: Type of screening: Colonoscopy. Completed 09/05/2019. Repeat every 10 years  Mammogram status: Completed Bilateral 11/17/2020. Repeat every year  Bone Density status: Completed 11/10/2019. Results reflect: Bone density results: OSTEOPENIA. Repeat every 2 years.  Lung Cancer Screening: (Low Dose CT Chest recommended if Age 4-80 years, 30 pack-year currently smoking OR have quit w/in 15years.) does not qualify.    Additional  Screening:  Hepatitis C Screening:Completed 09/07/2015  Vision Screening: Recommended annual ophthalmology exams for early detection of glaucoma and other disorders of the eye. Is the patient up to date with their annual eye exam?  {YES/NO:21197} Who is the provider or what is  the name of the office in which the patient attends annual eye exams? *** If pt is not established with a provider, would they like to be referred to a provider to establish care? {YES/NO:21197}.   Dental Screening: Recommended annual dental exams for proper oral hygiene  Community Resource Referral / Chronic Care Management: CRR required this visit?  {YES/NO:21197}  CCM required this visit?  {YES/NO:21197}     Plan:     I have personally reviewed and noted the following in the patient's chart:   . Medical and social history . Use of alcohol, tobacco or illicit drugs  . Current medications and supplements . Functional ability and status . Nutritional status . Physical activity . Advanced directives . List of other physicians . Hospitalizations, surgeries, and ER visits in previous 12 months . Vitals . Screenings to include cognitive, depression, and falls . Referrals and appointments  In addition, I have reviewed and discussed with patient certain preventive protocols, quality metrics, and best practice recommendations. A written personalized care plan for preventive services as well as general preventive health recommendations were provided to patient.   Due to this being a telephonic visit, the after visit summary with patients personalized plan was offered to patient via mail or my-chart. ***Patient declined at this time./ Patient would like to access on my-chart/ per request, patient was mailed a copy of AVS./ Patient preferred to pick up at office at next visit.   Marta Antu, LPN   1/61/0960  Nurse Health Advisor  Nurse Notes: ***

## 2021-02-03 IMAGING — MG DIGITAL SCREENING BILAT W/ TOMO W/ CAD
8 series · 9 of 24 positions shown · non-contrast
Comparison: Previous exam(s).

CLINICAL DATA: Screening.

EXAM:
DIGITAL SCREENING BILATERAL MAMMOGRAM WITH TOMO AND CAD

[R MLO synth-2D]
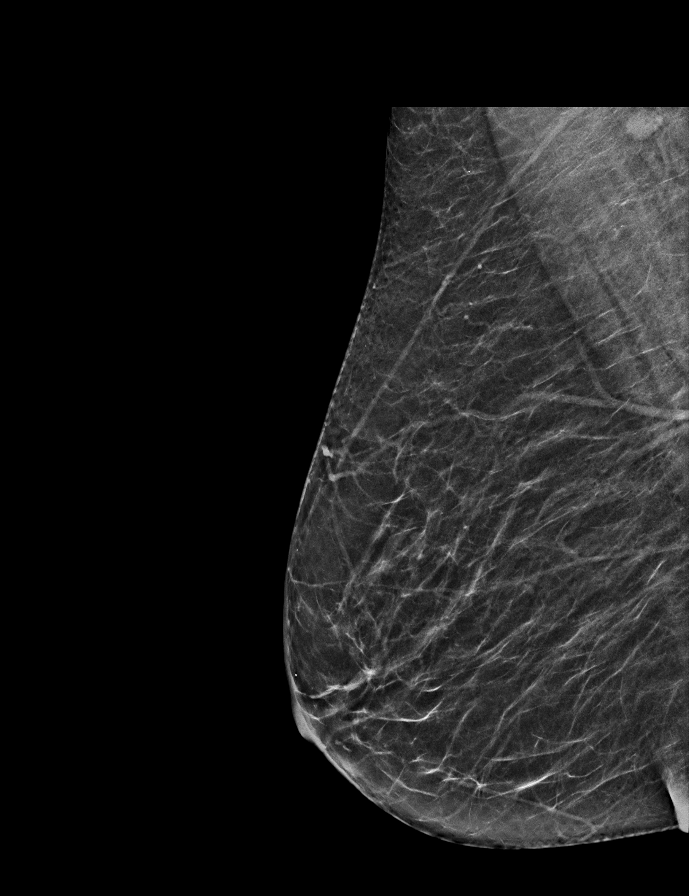

[R CC synth-2D]
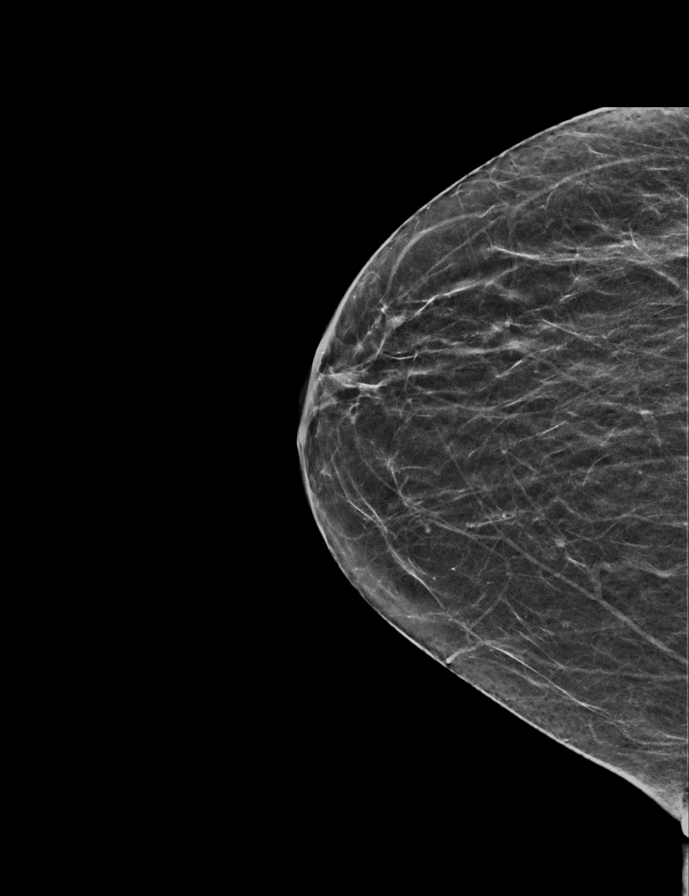

[L MLO synth-2D]
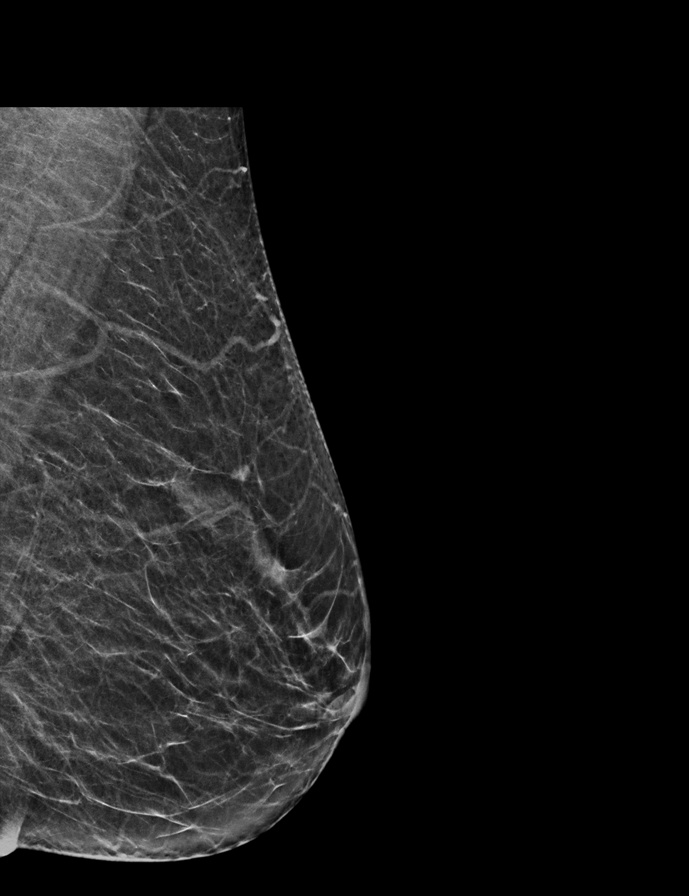

[L CC synth-2D]
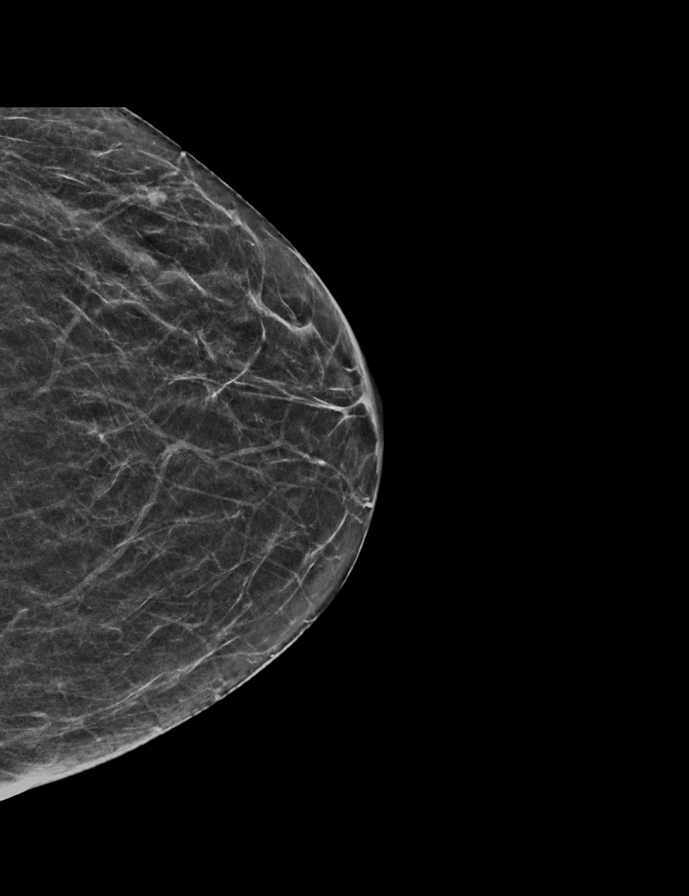

[R CC tomo · 2 of 44 frames shown]
[frame 15/44]
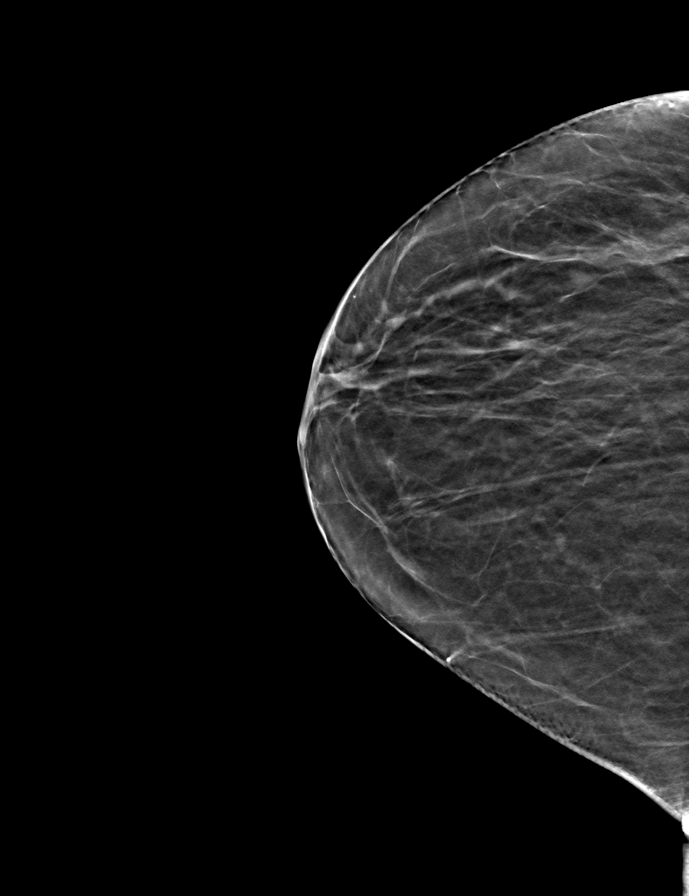
[frame 23/44]
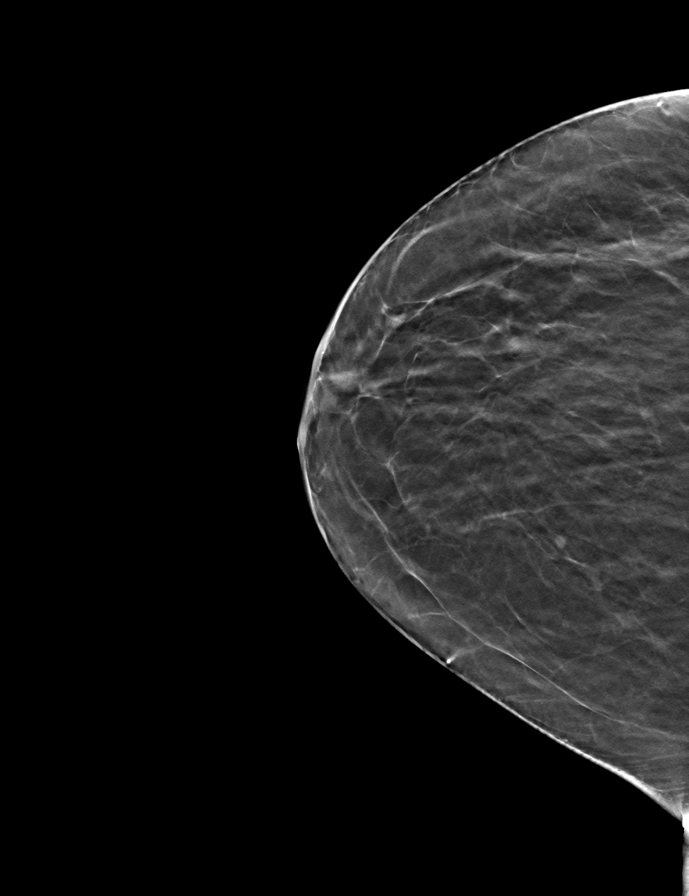

[R MLO tomo · tomo slice 26/51.0]
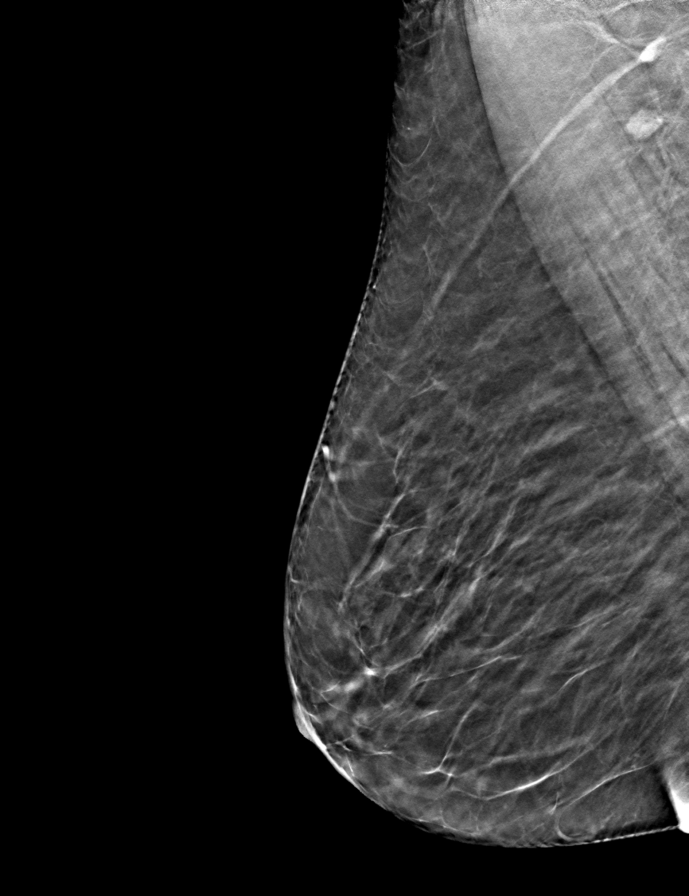

[L MLO tomo · tomo slice 27/54.0]
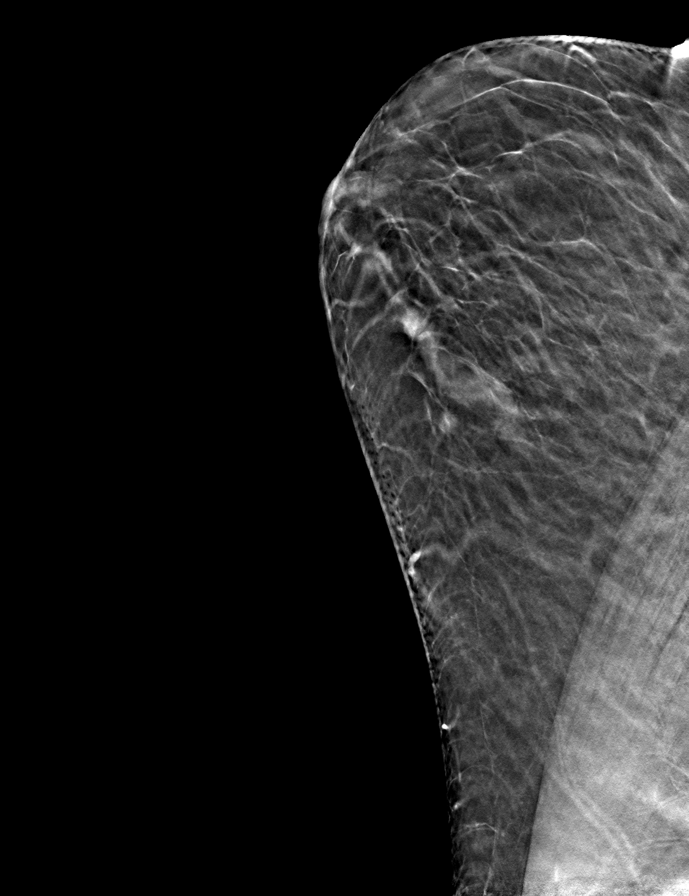

[L CC tomo · tomo slice 23/45.0]
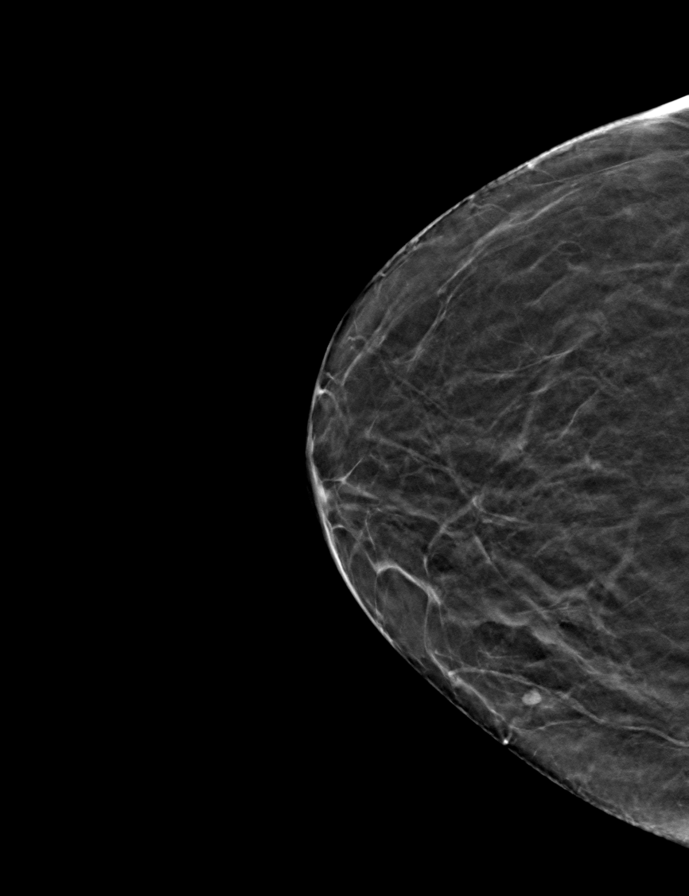

[9 of 24 positions shown; findings below may reference images not displayed]

ACR Breast Density Category b: There are scattered areas of
fibroglandular density.
FINDINGS: There are no findings suspicious for malignancy. Images were
processed with CAD.
IMPRESSION: No mammographic evidence of malignancy. A result letter of this
screening mammogram will be mailed directly to the patient.

RECOMMENDATION:
Screening mammogram in one year. (Code:[id number])

BI-RADS CATEGORY  1: Negative.

## 2021-03-15 ENCOUNTER — Telehealth: Payer: Self-pay | Admitting: Family Medicine

## 2021-03-15 NOTE — Telephone Encounter (Signed)
Hi Dawn, I called patient to schedule her annual wellness visit with the nurse.  Patient said she saw Dr. Carollee Herter on 11/12/20 for what she thought was her annual wellness visit, but she was charged for the visit.  The visit was over a year since her last visit.  Patient said she didn't want to schedule another wellness visit until this is straightened out because she was worried about getting charged. Can you please check into this?

## 2021-03-16 NOTE — Telephone Encounter (Signed)
I called patient and I let her know that the claim will be re filed and she won't have to pay bill. Patient scheduled AWV on 05/02/21 with [facility].

## 2021-05-01 NOTE — Progress Notes (Deleted)
Subjective:   Tanya Griffith is a 65 y.o. female who presents for Medicare Annual (Subsequent) preventive examination.  I connected with *** today by telephone and verified that I am speaking with the correct person using two identifiers. Location patient: home Location provider: work Persons participating in the virtual visit: patient, Marine scientist.    I discussed the limitations, risks, security and privacy concerns of performing an evaluation and management service by telephone and the availability of in person appointments. I also discussed with the patient that there may be a patient responsible charge related to this service. The patient expressed understanding and verbally consented to this telephonic visit.    Interactive audio and video telecommunications were attempted between this provider and patient, however failed, due to patient having technical difficulties OR patient did not have access to video capability.  We continued and completed visit with audio only.  Some vital signs may be absent or patient reported.   Time Spent with patient on telephone encounter: *** minutes   Review of Systems    ***       Objective:    There were no vitals filed for this visit. There is no height or weight on file to calculate BMI.  Advanced Directives 11/04/2019 06/21/2018 11/13/2016  Does Patient Have a Medical Advance Directive? No Yes No  Type of Advance Directive - [place];Living will -  Copy of [place] in Chart? - No - copy requested -  Would patient like information on creating a medical advance directive? No - Patient declined - Yes (MAU/Ambulatory/Procedural Areas - Information given)    Current Medications (verified) Outpatient Encounter Medications as of 05/02/2021  Medication Sig   aspirin 325 MG tablet Take 325 mg by mouth daily.   B Complex-C-Folic Acid (STRESS B COMPLEX PO) Take by mouth.   BIOTIN PO Take 10,000 mcg by mouth  daily.    bismuth subsalicylate (PEPTO-BISMOL) 262 MG chewable tablet Chew 3 tablets (786 mg total) by mouth 4 (four) times daily -  before meals and at bedtime.   Budesonide ER (UCERIS) 9 MG TB24 Take 9 mg by mouth daily.   Calcium-Magnesium (CAL-MAG PO) Take 3 tablets by mouth. Ca 1500 and mag 1000mg    Flaxseed, Linseed, (FLAX SEED OIL) 1000 MG CAPS Take by mouth.   Methylsulfonylmethane (MSM) 1000 MG TABS Take by mouth. 5 mg With glucosasmine hcl 1500mg    Omega-3 Fatty Acids (FISH OIL) 1000 MG CAPS Take by mouth.   Probiotic Product (PROBIOTIC ADVANCED PO) Take by mouth.   No facility-administered encounter medications on file as of 05/02/2021.    Allergies (verified) Other and Sulfa antibiotics   History: Past Medical History:  Diagnosis Date   Abdominal wall ulcer ([place])    Back problem    Chicken pox    Environmental allergies    IC (interstitial cystitis)    Interstitial cystitis    Mitral valve prolapse    Osteoarthritis (arthritis due to wear and tear of joints)    Past Surgical History:  Procedure Laterality Date   ABDOMINAL HYSTERECTOMY     BLADDER SURGERY     BLADDER SURGERY     CESAREAN SECTION     COLONOSCOPY  09/05/2019   KNEE SURGERY Right    NASAL SEPTUM SURGERY     OOPHORECTOMY     TONSILLECTOMY     Family History  Problem Relation Age of Onset   Dementia Mother  Vascular Dementia   Alzheimer's disease Mother    Arthritis Mother    Hyperlipidemia Mother    Transient ischemic attack Mother    Kidney disease Mother 94       Kidney Failure   Cancer Sister        skin   Arthritis Brother    Arthritis Maternal Grandfather    Arthritis Maternal Aunt    Heart attack Paternal Grandfather    Transient ischemic attack Maternal Aunt    Hypertension Brother    Diabetes Maternal Aunt    Diabetes Brother        Medication induced   Breast cancer Other        Niece--Double Mastectomy   Esophageal cancer Other    Pancreatic cancer Maternal Uncle     Colon cancer Neg Hx    Liver cancer Neg Hx    Rectal cancer Neg Hx    Stomach cancer Neg Hx    Social History   Socioeconomic History   Marital status: Married    Spouse name: Not on file   Number of children: 1   Years of education: Not on file   Highest education level: Not on file  Occupational History   Occupation: unemployed  Tobacco Use   Smoking status: Never   Smokeless tobacco: Never  Vaping Use   Vaping Use: Never used  Substance and Sexual Activity   Alcohol use: No   Drug use: No   Sexual activity: Not Currently  Other Topics Concern   Not on file  Social History Narrative   Exercise-walks   Social Determinants of Health   Financial Resource Strain: Not on file  Food Insecurity: Not on file  Transportation Needs: Not on file  Physical Activity: Not on file  Stress: Not on file  Social Connections: Not on file    Tobacco Counseling Counseling given: Not Answered   Clinical Intake:                 Diabetic?No         Activities of Daily Living No flowsheet data found.  Patient Care Team: Carollee Herter, Alferd Apa, DO as PCP - General (Family Medicine) Votanopoulos, Deeann Dowse, MD as Referring Physician (General Surgery) Ilsa Iha, MD as Referring Physician (General Practice) Aleene Davidson, MD as Referring Physician (Obstetrics and Gynecology) Ronnette Juniper, MD as Consulting Physician (Gastroenterology) Roseanne Kaufman, MD as Consulting Physician (Orthopedic Surgery)  Indicate any recent Medical Services you may have received from other than [facility] providers in the past year (date may be approximate).     Assessment:   This is a routine wellness examination for Tanya Griffith.  Hearing/Vision screen No results found.  Dietary issues and exercise activities discussed:     Goals Addressed   None    Depression Screen PHQ 2/9 Scores 11/04/2019 06/25/2018 06/21/2018 11/13/2016  PHQ - 2 Score 0 0 0 0  PHQ- 9 Score - 6 - -     Fall Risk Fall Risk  11/12/2020 11/04/2019 06/21/2018 11/13/2016  Falls in the past year? 1 1 1  Yes  Number falls in past yr: 0 0 0 1  Injury with Fall? 1 1 0 Yes  Comment - - - pt report she tripped over a root sticking up from the ground and hit her right shoulder.  Follow up - Education provided;Falls prevention discussed - -    FALL RISK PREVENTION PERTAINING TO THE HOME:  Any stairs in or around the home? {YES/NO:21197}  If so, are there any without handrails? {YES/NO:21197} Home free of loose throw rugs in walkways, pet beds, electrical cords, etc? {YES/NO:21197} Adequate lighting in your home to reduce risk of falls? {YES/NO:21197}  ASSISTIVE DEVICES UTILIZED TO PREVENT FALLS:  Life alert? {YES/NO:21197} Use of a cane, walker or w/c? {YES/NO:21197} Grab bars in the bathroom? {YES/NO:21197} Shower chair or bench in shower? {YES/NO:21197} Elevated toilet seat or a handicapped toilet? {YES/NO:21197}  TIMED UP AND GO:  Was the test performed? {YES/NO:21197}.  Length of time to ambulate 10 feet: *** sec.   {Appearance of Gait:2101803}  Cognitive Function: MMSE - Mini Mental State Exam 11/13/2016  Orientation to time 5  Orientation to Place 5  Registration 3  Attention/ Calculation 5  Recall 3  Language- name 2 objects 2  Language- repeat 1  Language- follow 3 step command 3  Language- read & follow direction 1  Write a sentence 1  Copy design 1  Total score 30        Immunizations Immunization History  Administered Date(s) Administered   PFIZER(Purple Top)SARS-COV-2 Vaccination 10/10/2019, 10/31/2019, 05/10/2020   Pneumococcal Polysaccharide-23 11/12/2020    TDAP status: Due, Education has been provided regarding the importance of this vaccine. Advised may receive this vaccine at local pharmacy or Health Dept. Aware to provide a copy of the vaccination record if obtained from local pharmacy or Health Dept. Verbalized acceptance and understanding.  Flu Vaccine  status: Due, Education has been provided regarding the importance of this vaccine. Advised may receive this vaccine at local pharmacy or Health Dept. Aware to provide a copy of the vaccination record if obtained from local pharmacy or Health Dept. Verbalized acceptance and understanding.  Pneumococcal vaccine status: Up to date  {Covid-19 vaccine status:2101808}  Qualifies for Shingles Vaccine? Yes   Zostavax completed No   Shingrix Completed?: No.    Education has been provided regarding the importance of this vaccine. Patient has been advised to call insurance company to determine out of pocket expense if they have not yet received this vaccine. Advised may also receive vaccine at local pharmacy or Health Dept. Verbalized acceptance and understanding.  Screening Tests Health Maintenance  Topic Date Due   Zoster Vaccines- Shingrix (1 of 2) Never done   COVID-19 Vaccine (4 - Booster for Pfizer series) 08/02/2020   INFLUENZA VACCINE  Never done   TETANUS/TDAP  06/21/2024 (Originally 02/02/1975)   DEXA SCAN  11/09/2021   MAMMOGRAM  11/17/2021   COLONOSCOPY (Pts 45-43yrs Insurance coverage will need to be confirmed)  09/04/2029   Hepatitis C Screening  Completed   HPV VACCINES  Aged Out    Health Maintenance  Health Maintenance Due  Topic Date Due   Zoster Vaccines- Shingrix (1 of 2) Never done   COVID-19 Vaccine (4 - Booster for Pfizer series) 08/02/2020   INFLUENZA VACCINE  Never done    Colorectal cancer screening: Type of screening: Colonoscopy. Completed 09/04/3030. Repeat every 10 years  Mammogram status: Completed bilateral 11/17/2020. Repeat every year   Bone Density status: Completed 11/10/2019. Results reflect: Bone density results: OSTEOPENIA. Repeat every 2 years.  Lung Cancer Screening: (Low Dose CT Chest recommended if Age 44-80 years, 30 pack-year currently smoking OR have quit w/in 15years.) {DOES NOT does:27190::"does not"} qualify.   Lung Cancer Screening Referral:  ***  Additional Screening:  Hepatitis C Screening: Completed 09/07/2015  Vision Screening: Recommended annual ophthalmology exams for early detection of glaucoma and other disorders of the eye. Is the patient up to date  with their annual eye exam?  {YES/NO:21197} Who is the provider or what is the name of the office in which the patient attends annual eye exams? *** If pt is not established with a provider, would they like to be referred to a provider to establish care? {YES/NO:21197}.   Dental Screening: Recommended annual dental exams for proper oral hygiene  Community Resource Referral / Chronic Care Management: CRR required this visit?  {YES/NO:21197}  CCM required this visit?  {YES/NO:21197}     Plan:     I have personally reviewed and noted the following in the patient's chart:   Medical and social history Use of alcohol, tobacco or illicit drugs  Current medications and supplements including opioid prescriptions.  Functional ability and status Nutritional status Physical activity Advanced directives List of other physicians Hospitalizations, surgeries, and ER visits in previous 12 months Vitals Screenings to include cognitive, depression, and falls Referrals and appointments  In addition, I have reviewed and discussed with patient certain preventive protocols, quality metrics, and best practice recommendations. A written personalized care plan for preventive services as well as general preventive health recommendations were provided to patient.   Due to this being a telephonic visit, the after visit summary with patients personalized plan was offered to patient via mail or my-chart. ***Patient declined at this time./ Patient would like to access on my-chart/ per request, patient was mailed a copy of AVS./ Patient preferred to pick up at office at next visit.   Marta Antu, LPN   9/93/5701  Nurse Health Advisor  Nurse Notes: ***

## 2021-05-02 ENCOUNTER — Telehealth: Payer: Self-pay

## 2021-05-02 ENCOUNTER — Ambulatory Visit: Payer: Medicare Other

## 2021-05-02 NOTE — Telephone Encounter (Signed)
Spoke with patient today regarding DOS 11/12/20 and a coding error resulting in patient getting billed a $42.00 charge which resulted in her being turned over to bad debt.  [employer] providing correct diagnosis codes to have them refiled to patient's [employer] and requested patient be removed from bad debt status.  I advised patient to please reach out to me for further assistance with this issue if need be.  (Please correct dos 11/12/2020 dx for 61443 should be E78.5 15400-Q67.6,  and 36415-E78.5.  19509 K52.831, Please refile to Medicare and remove from [place].) portion of email sent to Billing Leadership

## 2021-05-17 ENCOUNTER — Ambulatory Visit: Payer: Medicare Other | Admitting: Family Medicine

## 2021-08-11 ENCOUNTER — Ambulatory Visit (INDEPENDENT_AMBULATORY_CARE_PROVIDER_SITE_OTHER): Payer: Medicare Other

## 2021-08-11 ENCOUNTER — Ambulatory Visit (INDEPENDENT_AMBULATORY_CARE_PROVIDER_SITE_OTHER): Payer: Medicare Other | Admitting: Podiatry

## 2021-08-11 ENCOUNTER — Encounter: Payer: Self-pay | Admitting: Podiatry

## 2021-08-11 ENCOUNTER — Other Ambulatory Visit: Payer: Self-pay

## 2021-08-11 DIAGNOSIS — M19079 Primary osteoarthritis, unspecified ankle and foot: Secondary | ICD-10-CM | POA: Diagnosis not present

## 2021-08-11 DIAGNOSIS — M19071 Primary osteoarthritis, right ankle and foot: Secondary | ICD-10-CM

## 2021-08-11 DIAGNOSIS — G5791 Unspecified mononeuropathy of right lower limb: Secondary | ICD-10-CM

## 2021-08-11 MED ORDER — MELOXICAM 15 MG PO TABS: 15.0000 mg | ORAL_TABLET | Freq: Every day | ORAL | 0 refills | Status: DC

## 2021-08-11 NOTE — Progress Notes (Signed)
°  Subjective:  Patient ID: Tanya Griffith, female    DOB: 23-Dec-1955,   MRN: [id number]  Chief Complaint  Patient presents with   Foot Pain     foot painful and swollen -    66 y.o. female presents for concern of right foot pain that has been present for a couple months. Relates the pain is at the top of the foot and causes burning. Relates the pain goes up her leg. Denies any injury .Has been using OTC medications for pain with some relief.  Does have a history of scoliosis and pain in her back. Denies any other pedal complaints. Denies n/v/f/c.   Past Medical History:  Diagnosis Date   Abdominal wall ulcer ([place])    Back problem    Chicken pox    Environmental allergies    IC (interstitial cystitis)    Interstitial cystitis    Mitral valve prolapse    Osteoarthritis (arthritis due to wear and tear of joints)     Objective:  Physical Exam: Vascular: DP/PT pulses 2/4 bilateral. CFT <3 seconds. Normal hair growth on digits. No edema.  Skin. No lacerations or abrasions bilateral feet.  Musculoskeletal: MMT 5/5 bilateral lower extremities in DF, PF, Inversion and Eversion. Deceased ROM in DF of ankle joint. Mild tenderness to dorsal midfoot over first second and third TMTJ. Some soreness with motion of the first through third rays. Negative tinel sign.  Neurological: Sensation intact to light touch.   Assessment:   1. Arthritis of midfoot   2. Neuritis of right foot      Plan:  Patient was evaluated and treated and all questions answered. X-rays reviewed and discussed with patient. No acute fractures or dislocations noted. Degenerative changes noted throughout midfoot with metadductus noted. Soft tissue edema noted to dorsal foot.  Reviewed previous notes from PCP.  Discussed midfoot arthritis with patient and treatment options.  Discussed NSAIDS, topicals, and possible injections.  Prescription for meloxicam provided. Discussed stiff soled shoes and carbon fiber foot  plate.  Did discuss back pain and potential for some of this pain to be coming from the back. Patient will follow-up with PCP for this.  Discussed if pain does not improve can discuss surgical options.  Patient to follow-up as needed.     Lorenda Peck, DPM

## 2021-08-31 DIAGNOSIS — M25512 Pain in left shoulder: Secondary | ICD-10-CM | POA: Diagnosis not present

## 2021-08-31 DIAGNOSIS — K52831 Collagenous colitis: Secondary | ICD-10-CM | POA: Diagnosis not present

## 2021-08-31 DIAGNOSIS — M5431 Sciatica, right side: Secondary | ICD-10-CM | POA: Diagnosis not present

## 2021-08-31 DIAGNOSIS — R635 Abnormal weight gain: Secondary | ICD-10-CM | POA: Diagnosis not present

## 2021-08-31 DIAGNOSIS — M170 Bilateral primary osteoarthritis of knee: Secondary | ICD-10-CM | POA: Diagnosis not present

## 2021-08-31 DIAGNOSIS — G8929 Other chronic pain: Secondary | ICD-10-CM | POA: Diagnosis not present

## 2021-08-31 DIAGNOSIS — M25511 Pain in right shoulder: Secondary | ICD-10-CM | POA: Diagnosis not present

## 2021-08-31 DIAGNOSIS — R29890 Loss of height: Secondary | ICD-10-CM | POA: Diagnosis not present

## 2021-09-07 ENCOUNTER — Other Ambulatory Visit: Payer: Self-pay | Admitting: Podiatry

## 2021-10-06 ENCOUNTER — Other Ambulatory Visit: Payer: Self-pay | Admitting: Podiatry

## 2021-10-31 DIAGNOSIS — M19079 Primary osteoarthritis, unspecified ankle and foot: Secondary | ICD-10-CM | POA: Diagnosis not present

## 2021-11-02 DIAGNOSIS — M19071 Primary osteoarthritis, right ankle and foot: Secondary | ICD-10-CM | POA: Diagnosis not present

## 2021-11-29 DIAGNOSIS — M5441 Lumbago with sciatica, right side: Secondary | ICD-10-CM | POA: Diagnosis not present

## 2021-11-29 DIAGNOSIS — L900 Lichen sclerosus et atrophicus: Secondary | ICD-10-CM | POA: Diagnosis not present

## 2021-11-29 DIAGNOSIS — G8929 Other chronic pain: Secondary | ICD-10-CM | POA: Diagnosis not present

## 2021-11-29 DIAGNOSIS — K52831 Collagenous colitis: Secondary | ICD-10-CM | POA: Diagnosis not present

## 2021-12-02 ENCOUNTER — Telehealth: Payer: Self-pay | Admitting: Family Medicine

## 2021-12-02 NOTE — Telephone Encounter (Signed)
Left message for patient to call back and schedule Medicare Annual Wellness Visit (AWV).  ? ?Please offer to do virtually or by telephone.  Left office number and my jabber [phone]. ? ?Last AWV:11/04/2019 ? ?Please schedule at anytime with Nurse Health Advisor. ?  ?

## 2022-01-03 DIAGNOSIS — R208 Other disturbances of skin sensation: Secondary | ICD-10-CM | POA: Diagnosis not present

## 2022-01-03 DIAGNOSIS — G8929 Other chronic pain: Secondary | ICD-10-CM | POA: Diagnosis not present

## 2022-01-03 DIAGNOSIS — M5459 Other low back pain: Secondary | ICD-10-CM | POA: Diagnosis not present

## 2022-01-13 DIAGNOSIS — R222 Localized swelling, mass and lump, trunk: Secondary | ICD-10-CM | POA: Diagnosis not present

## 2022-01-13 DIAGNOSIS — D213 Benign neoplasm of connective and other soft tissue of thorax: Secondary | ICD-10-CM | POA: Diagnosis not present

## 2022-01-13 DIAGNOSIS — D219 Benign neoplasm of connective and other soft tissue, unspecified: Secondary | ICD-10-CM | POA: Diagnosis not present

## 2022-03-03 DIAGNOSIS — F439 Reaction to severe stress, unspecified: Secondary | ICD-10-CM | POA: Diagnosis not present

## 2022-03-03 DIAGNOSIS — R6889 Other general symptoms and signs: Secondary | ICD-10-CM | POA: Diagnosis not present

## 2022-03-03 DIAGNOSIS — N951 Menopausal and female climacteric states: Secondary | ICD-10-CM | POA: Diagnosis not present

## 2022-03-03 DIAGNOSIS — G4700 Insomnia, unspecified: Secondary | ICD-10-CM | POA: Diagnosis not present

## 2022-03-08 DIAGNOSIS — M5459 Other low back pain: Secondary | ICD-10-CM | POA: Diagnosis not present

## 2022-03-08 DIAGNOSIS — R718 Other abnormality of red blood cells: Secondary | ICD-10-CM | POA: Diagnosis not present

## 2022-03-08 DIAGNOSIS — E559 Vitamin D deficiency, unspecified: Secondary | ICD-10-CM | POA: Diagnosis not present

## 2022-03-08 DIAGNOSIS — G8929 Other chronic pain: Secondary | ICD-10-CM | POA: Diagnosis not present

## 2022-03-16 DIAGNOSIS — M19079 Primary osteoarthritis, unspecified ankle and foot: Secondary | ICD-10-CM | POA: Diagnosis not present

## 2022-03-22 DIAGNOSIS — M5459 Other low back pain: Secondary | ICD-10-CM | POA: Diagnosis not present

## 2022-03-22 DIAGNOSIS — M25571 Pain in right ankle and joints of right foot: Secondary | ICD-10-CM | POA: Diagnosis not present

## 2022-03-22 DIAGNOSIS — G8929 Other chronic pain: Secondary | ICD-10-CM | POA: Diagnosis not present

## 2022-03-29 DIAGNOSIS — M25474 Effusion, right foot: Secondary | ICD-10-CM | POA: Diagnosis not present

## 2022-03-29 DIAGNOSIS — M19079 Primary osteoarthritis, unspecified ankle and foot: Secondary | ICD-10-CM | POA: Diagnosis not present

## 2022-04-11 DIAGNOSIS — N8110 Cystocele, unspecified: Secondary | ICD-10-CM | POA: Diagnosis not present

## 2022-04-11 DIAGNOSIS — L280 Lichen simplex chronicus: Secondary | ICD-10-CM | POA: Diagnosis not present

## 2022-04-11 DIAGNOSIS — N3010 Interstitial cystitis (chronic) without hematuria: Secondary | ICD-10-CM | POA: Diagnosis not present

## 2022-04-11 DIAGNOSIS — Z90710 Acquired absence of both cervix and uterus: Secondary | ICD-10-CM | POA: Diagnosis not present

## 2022-04-11 DIAGNOSIS — N8111 Cystocele, midline: Secondary | ICD-10-CM | POA: Diagnosis not present

## 2022-04-11 DIAGNOSIS — Z882 Allergy status to sulfonamides status: Secondary | ICD-10-CM | POA: Diagnosis not present

## 2022-04-11 DIAGNOSIS — N816 Rectocele: Secondary | ICD-10-CM | POA: Diagnosis not present

## 2022-04-11 DIAGNOSIS — Z90722 Acquired absence of ovaries, bilateral: Secondary | ICD-10-CM | POA: Diagnosis not present

## 2022-04-11 DIAGNOSIS — N952 Postmenopausal atrophic vaginitis: Secondary | ICD-10-CM | POA: Diagnosis not present

## 2022-04-13 DIAGNOSIS — M19079 Primary osteoarthritis, unspecified ankle and foot: Secondary | ICD-10-CM | POA: Diagnosis not present

## 2022-04-13 DIAGNOSIS — M7731 Calcaneal spur, right foot: Secondary | ICD-10-CM | POA: Diagnosis not present

## 2022-04-13 DIAGNOSIS — R718 Other abnormality of red blood cells: Secondary | ICD-10-CM | POA: Diagnosis not present

## 2022-04-13 DIAGNOSIS — G8929 Other chronic pain: Secondary | ICD-10-CM | POA: Diagnosis not present

## 2022-04-13 DIAGNOSIS — M545 Low back pain, unspecified: Secondary | ICD-10-CM | POA: Diagnosis not present

## 2022-04-13 DIAGNOSIS — M79671 Pain in right foot: Secondary | ICD-10-CM | POA: Diagnosis not present

## 2022-04-13 DIAGNOSIS — E559 Vitamin D deficiency, unspecified: Secondary | ICD-10-CM | POA: Diagnosis not present

## 2022-04-13 DIAGNOSIS — M19071 Primary osteoarthritis, right ankle and foot: Secondary | ICD-10-CM | POA: Diagnosis not present

## 2022-04-13 DIAGNOSIS — M2141 Flat foot [pes planus] (acquired), right foot: Secondary | ICD-10-CM | POA: Diagnosis not present

## 2022-04-13 DIAGNOSIS — M1909 Primary osteoarthritis, other specified site: Secondary | ICD-10-CM | POA: Diagnosis not present

## 2022-04-13 DIAGNOSIS — M85871 Other specified disorders of bone density and structure, right ankle and foot: Secondary | ICD-10-CM | POA: Diagnosis not present

## 2022-04-23 DIAGNOSIS — Z23 Encounter for immunization: Secondary | ICD-10-CM | POA: Diagnosis not present

## 2022-05-29 DIAGNOSIS — Z23 Encounter for immunization: Secondary | ICD-10-CM | POA: Diagnosis not present

## 2022-06-20 DIAGNOSIS — H02403 Unspecified ptosis of bilateral eyelids: Secondary | ICD-10-CM | POA: Diagnosis not present

## 2022-06-20 DIAGNOSIS — H524 Presbyopia: Secondary | ICD-10-CM | POA: Diagnosis not present

## 2022-06-20 DIAGNOSIS — H25813 Combined forms of age-related cataract, bilateral: Secondary | ICD-10-CM | POA: Diagnosis not present

## 2022-06-20 DIAGNOSIS — H04123 Dry eye syndrome of bilateral lacrimal glands: Secondary | ICD-10-CM | POA: Diagnosis not present

## 2022-06-20 DIAGNOSIS — H4322 Crystalline deposits in vitreous body, left eye: Secondary | ICD-10-CM | POA: Diagnosis not present

## 2022-06-20 DIAGNOSIS — H526 Other disorders of refraction: Secondary | ICD-10-CM | POA: Diagnosis not present

## 2023-03-16 NOTE — Telephone Encounter (Signed)
Pcp removed
